# Patient Record
Sex: Male | Born: 1955 | Race: White | Hispanic: No | Marital: Married | Smoking: Current every day smoker
Health system: Southern US, Community
[De-identification: ages and names within clinical notes are randomized; demographics above are authoritative.]

## PROBLEM LIST (undated history)

## (undated) DIAGNOSIS — N200 Calculus of kidney: Secondary | ICD-10-CM

## (undated) DIAGNOSIS — I10 Essential (primary) hypertension: Secondary | ICD-10-CM

## (undated) HISTORY — DX: Calculus of kidney: N20.0

## (undated) HISTORY — PX: OTHER SURGICAL HISTORY: SHX169

---

## 2013-08-10 ENCOUNTER — Encounter: Payer: Self-pay | Admitting: *Deleted

## 2013-08-27 ENCOUNTER — Encounter: Payer: Self-pay | Admitting: Family Medicine

## 2013-08-27 ENCOUNTER — Ambulatory Visit (INDEPENDENT_AMBULATORY_CARE_PROVIDER_SITE_OTHER): Payer: Managed Care, Other (non HMO) | Admitting: Family Medicine

## 2013-08-27 VITALS — BP 160/90 | HR 98 | Temp 98.2°F | Resp 16 | Ht 69.0 in | Wt 170.0 lb

## 2013-08-27 DIAGNOSIS — F17200 Nicotine dependence, unspecified, uncomplicated: Secondary | ICD-10-CM

## 2013-08-27 DIAGNOSIS — Z125 Encounter for screening for malignant neoplasm of prostate: Secondary | ICD-10-CM

## 2013-08-27 DIAGNOSIS — Z0000 Encounter for general adult medical examination without abnormal findings: Secondary | ICD-10-CM

## 2013-08-27 DIAGNOSIS — Z720 Tobacco use: Secondary | ICD-10-CM | POA: Insufficient documentation

## 2013-08-27 DIAGNOSIS — R030 Elevated blood-pressure reading, without diagnosis of hypertension: Secondary | ICD-10-CM

## 2013-08-27 LAB — CBC WITH DIFFERENTIAL/PLATELET
BASOS PCT: 0 % (ref 0–1)
Basophils Absolute: 0 10*3/uL (ref 0.0–0.1)
Eosinophils Absolute: 0.2 10*3/uL (ref 0.0–0.7)
Eosinophils Relative: 2 % (ref 0–5)
HCT: 46.1 % (ref 39.0–52.0)
HEMOGLOBIN: 16.5 g/dL (ref 13.0–17.0)
Lymphocytes Relative: 24 % (ref 12–46)
Lymphs Abs: 2.3 10*3/uL (ref 0.7–4.0)
MCH: 31.1 pg (ref 26.0–34.0)
MCHC: 35.8 g/dL (ref 30.0–36.0)
MCV: 86.8 fL (ref 78.0–100.0)
Monocytes Absolute: 0.9 10*3/uL (ref 0.1–1.0)
Monocytes Relative: 9 % (ref 3–12)
NEUTROS ABS: 6.3 10*3/uL (ref 1.7–7.7)
NEUTROS PCT: 65 % (ref 43–77)
Platelets: 347 10*3/uL (ref 150–400)
RBC: 5.31 MIL/uL (ref 4.22–5.81)
RDW: 12.9 % (ref 11.5–15.5)
WBC: 9.7 10*3/uL (ref 4.0–10.5)

## 2013-08-27 LAB — LIPID PANEL
Cholesterol: 190 mg/dL (ref 0–200)
HDL: 37 mg/dL — ABNORMAL LOW (ref >39)
LDL CALC: 128 mg/dL — AB (ref 0–99)
Total CHOL/HDL Ratio: 5.1 Ratio
Triglycerides: 127 mg/dL (ref <150)
VLDL: 25 mg/dL (ref 0–40)

## 2013-08-27 LAB — COMPREHENSIVE METABOLIC PANEL
ALK PHOS: 75 U/L (ref 39–117)
ALT: 12 U/L (ref 0–53)
AST: 16 U/L (ref 0–37)
Albumin: 4.3 g/dL (ref 3.5–5.2)
BUN: 11 mg/dL (ref 6–23)
CO2: 29 mEq/L (ref 19–32)
Calcium: 9.5 mg/dL (ref 8.4–10.5)
Chloride: 104 mEq/L (ref 96–112)
Creat: 0.96 mg/dL (ref 0.50–1.35)
Glucose, Bld: 89 mg/dL (ref 70–99)
Potassium: 4.6 mEq/L (ref 3.5–5.3)
SODIUM: 142 meq/L (ref 135–145)
Total Bilirubin: 0.8 mg/dL (ref 0.2–1.2)
Total Protein: 6.9 g/dL (ref 6.0–8.3)

## 2013-08-27 MED ORDER — VARENICLINE TARTRATE 1 MG PO TABS: 1.0000 mg | ORAL_TABLET | Freq: Two times a day (BID) | ORAL | Status: DC

## 2013-08-27 NOTE — Assessment & Plan Note (Signed)
Start chatinx, start pack for first 2 weeks given as sample

## 2013-08-27 NOTE — Progress Notes (Signed)
Patient ID: Donald ShengDavid Pedregon, male   DOB: 1955/07/09, 58 y.o.   MRN: [id number]   Subjective:    Patient ID: Donald Hale, male    DOB: 1955/07/09, 58 y.o.   MRN: [id number]  Patient presents for New Patient  Patient here to establish care. No previous PCP he was seen a few years ago when he had kidney stones otherwise discussed the urgent care if needed. He takes vitamins on a regular basis as well as aspirin 81 mg. He does have a family history of heart disease his mother died at age 57 with a heart attack. His family otherwise does not do the doctor on a regular basis therefore he is unsure of any other medical history. His only surgeries tonsils removed when he was a young child. He does smoke a pack and a half a day would like to try to quit. He's tried all the over-the-counter medications including patches and would like to try Chantix.  He has poor dentition and is trying to get his teeth removed and dentures placed continue he needs a physical examination first before anything could be done.  He's never had a colonoscopy no immunizations and declines these today. He also declines rectal examination   Review Of Systems:  GEN- denies fatigue, fever, weight loss,weakness, recent illness HEENT- denies eye drainage, change in vision, nasal discharge, CVS- denies chest pain, palpitations RESP- denies SOB, cough, wheeze ABD- denies N/V, change in stools, abd pain GU- denies dysuria, hematuria, dribbling, incontinence MSK- denies joint pain, muscle aches, injury Neuro- denies headache, dizziness, syncope, seizure activity       Objective:    BP 162/98  Pulse 98  Temp(Src) 98.2 F (36.8 C) (Oral)  Resp 16  Ht 5\' 9"  (1.753 m)  Wt 170 lb (77.111 kg)  BMI 25.09 kg/m2 GEN- NAD, alert and oriented x3 HEENT- PERRL, EOMI, non injected sclera, pink conjunctiva, MMM, oropharynx clear- poor dentition, TM clear bilat no effusion, nares clear Neck- Supple, no thyromegaly CVS- RRR, no  murmur RESP-CTAB ABD-NABS,soft,NT,ND GU- Deferred Psych- normal affect and mood EXT- No edema Pulses- Radial, DP- 2+  EKG- NSR, no ST changes      Assessment & Plan:      Problem List Items Addressed This Visit   None      Note: This dictation was prepared with Dragon dictation along with smaller phrase technology. Any transcriptional errors that result from this process are unintentional.

## 2013-08-27 NOTE — Assessment & Plan Note (Signed)
CPE done Declines colonoscopy Declines TDAP Fasting labs to be done, PSA screening

## 2013-08-27 NOTE — Patient Instructions (Addendum)
We will call with lab results Start chantix, set your quit date I recommend colonoscopy, please review material on this  F/U 6 weeks for blood pressureColorectal Cancer Screening Colorectal cancer screening is done to detect early disease. Colorectal refers to the colon and rectum. The colon and rectum are located at the end of the large intestine (digestive system), and carry your bowel movements out of the body. Screening may be done even if you are not experiencing symptoms.  Colorectal cancer screening checks for:  Polyps. These are small growths in the lining of the colon that can turn cancerous.  Cancer that is already growing. Cancer is a cluster of abnormal cells that can cause problems in the body. REASONS FOR COLORECTAL CANCER SCREENING  It is common for polyps to form in the lining of the colon, especially in older people. These polyps can be cancerous or become cancerous.  Caught early, colorectal cancer is treatable.  Cancer can be life threatening. Detecting or preventing cancer early can save your life and allow you to enjoy life longer. TYPES OF SCREENING  Fecal occult blood testing. A stool sample is examined for blood in the laboratory.  Sigmoidoscopy. A sigmoidoscope is used to examine the rectum and lower colon. A sigmoidoscope is a flexible tube with a camera that is inserted through your anus to examine your lower rectum.  Colonoscopy. The longer colonoscope is used to examine the entire colon. A colonoscope is also a thin, flexible tube with a camera. This test examines the colon and rectum. Other tests include:  Digital rectal exam.  Barium enema.  Stool DNA test.  Virtual colonoscopy is the use of computerized X-ray scan (computed tomography, CT) to take X-ray images of your colon. WHO SHOULD HAVE COLORECTAL CANCER SCREENING?  Screening is recommended for all adults aged 57 to 75 years.  Screening is generally done every 5 to 10 years or more frequently if  you have a family history or symptoms.  Screening is rarely recommended in adults aged 57 to 85 years. Screening is not recommended in adults aged 57 years and older. Your caregiver may recommend screening at a younger age and more frequent screening if you have:  A history of colorectal cancer or polyps.  Family members with histories of colorectal cancer or polyps.  Inflammatory bowel disease, such as ulcerative colitis or Crohn's disease.  A type of hereditary colon cancer syndrome. Talk with your caregiver about any symptoms, personal and family history. SYMPTOMS OF COLORECTAL CANCER It is important to discuss the following symptoms with your caregiver. These symptoms may be the result of other conditions and may be easily treated:  Rectal bleeding.  Blood in your stool.  Changes in bowel movements (hard or loose stools). These changes may last several weeks.  Abdominal cramping.  Feeling the pressure to have a bowel movement when there is no bowel movement.  Feeling tired or weak.  Unexplained weight loss.  Unexplained low red blood cell count. This may also be called iron deficiency anemia. HOME CARE INSTRUCTIONS   Follow up with your caregiver as directed.  Follow all instructions for preparation before your test as well as after. PREVENTION  Following healthy lifestyle habits each day can reduce your chance of getting colorectal cancer and many other types of cancer:  Eat a healthy, well-balanced diet rich in fruits and vegetables and low in fats, sugars and cholesterol.  Stay active. Try to exercise at least 4 to 6 times per week for 30 minutes.  Maintain a healthy weight. Ask your caregiver what a healthy weight range is for you.  Women should only drink 1 alcoholic drink per day. Men should only drink 2 alcoholic drinks per day.  Quit smoking. SEEK MEDICAL CARE IF:   You experience abdominal or rectal symptoms (see Symptoms of Colorectal Cancer).  Your  gastrointestinal issues (constipation, diarrhea) do not go away as expected.  You have questions or concerns. FOR MORE INFORMATION  American Academy of Family Physicians www.familydoctor.org  Centers for Disease Control and Prevention FootballExhibition.com.br  [place] Preventive Services Task Force www.uspreventiveservicestaskforce.org  American Cancer Society www.cancer.org MAKE SURE YOU:   Understand these instructions.  Will watch your condition.  Will get help right away if you are not doing well or get worse. Always follow up with your caregiver to find out the results of your tests. Not all test results may be available during your visit. If your test results are not back during the visit, make an appointment with your caregiver to find out the results. Do not assume everything is normal if you have not heard from your caregiver or the medical facility. It is important for you to follow up on all of your test results.  Document Released: 07/04/2009 Document Revised: 04/08/2011 Document Reviewed: 04/22/2013 [facility] Patient Information 2015 [place], [place]. This information is not intended to replace advice given to you by your health care provider. Make sure you discuss any questions you have with your health care provider.

## 2013-08-27 NOTE — Assessment & Plan Note (Addendum)
Blood pressure elevated today. Will have her followup in 6 weeks. Did do an EKG due to his family history of heart disease and elevated blood pressure today which was normal. His blood pressure still elevated in 6 weeks and will start him on blood pressure medication.

## 2013-08-28 LAB — PSA: PSA: 0.73 ng/mL (ref <=4.00)

## 2013-08-30 ENCOUNTER — Encounter: Payer: Self-pay | Admitting: *Deleted

## 2013-09-13 ENCOUNTER — Telehealth: Payer: Self-pay | Admitting: Family Medicine

## 2013-09-13 MED ORDER — BUPROPION HCL ER (SMOKING DET) 150 MG PO TB12: 150.0000 mg | ORAL_TABLET | Freq: Two times a day (BID) | ORAL | Status: DC

## 2013-09-13 NOTE — Telephone Encounter (Signed)
Prescription sent to pharmacy. .   Call placed to patient and patient made aware.  

## 2013-09-13 NOTE — Telephone Encounter (Signed)
MD please advise

## 2013-09-13 NOTE — Telephone Encounter (Signed)
Patient says that the chantix we prescribed, the insurance will not pay for it and it is 400 dollars. Wants to know if there is anything else we can prescribe to quit smoking  [phone] [address] [facility]

## 2013-09-13 NOTE — Telephone Encounter (Signed)
Send Zyban 150mg  BID,, start zyban 150mg  once a day for 3 days, then increase to BID dosing

## 2013-09-21 ENCOUNTER — Emergency Department (HOSPITAL_COMMUNITY)
Admission: EM | Admit: 2013-09-21 | Discharge: 2013-09-21 | Disposition: A | Discharge status: Home / Self Care | Payer: Managed Care, Other (non HMO) | Attending: Emergency Medicine | Admitting: Emergency Medicine

## 2013-09-21 ENCOUNTER — Emergency Department (HOSPITAL_COMMUNITY): Payer: Managed Care, Other (non HMO)

## 2013-09-21 ENCOUNTER — Encounter (HOSPITAL_COMMUNITY): Payer: Self-pay | Admitting: Emergency Medicine

## 2013-09-21 ENCOUNTER — Encounter: Payer: Self-pay | Admitting: Family Medicine

## 2013-09-21 DIAGNOSIS — R51 Headache: Secondary | ICD-10-CM | POA: Insufficient documentation

## 2013-09-21 DIAGNOSIS — R519 Headache, unspecified: Secondary | ICD-10-CM

## 2013-09-21 DIAGNOSIS — Z79899 Other long term (current) drug therapy: Secondary | ICD-10-CM | POA: Insufficient documentation

## 2013-09-21 DIAGNOSIS — F17200 Nicotine dependence, unspecified, uncomplicated: Secondary | ICD-10-CM | POA: Diagnosis not present

## 2013-09-21 DIAGNOSIS — Z7982 Long term (current) use of aspirin: Secondary | ICD-10-CM | POA: Diagnosis not present

## 2013-09-21 DIAGNOSIS — I10 Essential (primary) hypertension: Secondary | ICD-10-CM

## 2013-09-21 DIAGNOSIS — Z87442 Personal history of urinary calculi: Secondary | ICD-10-CM | POA: Diagnosis not present

## 2013-09-21 DIAGNOSIS — J329 Chronic sinusitis, unspecified: Secondary | ICD-10-CM | POA: Diagnosis not present

## 2013-09-21 LAB — BASIC METABOLIC PANEL
Anion gap: 13 (ref 5–15)
BUN: 9 mg/dL (ref 6–23)
CO2: 25 mEq/L (ref 19–32)
CREATININE: 0.99 mg/dL (ref 0.50–1.35)
Calcium: 9 mg/dL (ref 8.4–10.5)
Chloride: 101 mEq/L (ref 96–112)
GFR, EST NON AFRICAN AMERICAN: 89 mL/min — AB (ref >90)
Glucose, Bld: 98 mg/dL (ref 70–99)
Potassium: 3.3 mEq/L — ABNORMAL LOW (ref 3.7–5.3)
Sodium: 139 mEq/L (ref 137–147)

## 2013-09-21 LAB — CBC
HEMATOCRIT: 44.7 % (ref 39.0–52.0)
Hemoglobin: 15.4 g/dL (ref 13.0–17.0)
MCH: 30.3 pg (ref 26.0–34.0)
MCHC: 34.5 g/dL (ref 30.0–36.0)
MCV: 88 fL (ref 78.0–100.0)
Platelets: 318 10*3/uL (ref 150–400)
RBC: 5.08 MIL/uL (ref 4.22–5.81)
RDW: 12.8 % (ref 11.5–15.5)
WBC: 10 10*3/uL (ref 4.0–10.5)

## 2013-09-21 LAB — I-STAT TROPONIN, ED
Troponin i, poc: 0 ng/mL (ref 0.00–0.08)
Troponin i, poc: 0 ng/mL (ref 0.00–0.08)

## 2013-09-21 MED ORDER — AEROCHAMBER PLUS W/MASK MISC
1.0000 | Freq: Once | Status: DC
  Filled 2013-09-21: qty 1

## 2013-09-21 MED ORDER — ALBUTEROL SULFATE (2.5 MG/3ML) 0.083% IN NEBU
5.0000 mg | INHALATION_SOLUTION | Freq: Once | RESPIRATORY_TRACT | Status: AC
  Administered 2013-09-21: 5 mg via RESPIRATORY_TRACT
  Filled 2013-09-21: qty 6

## 2013-09-21 MED ORDER — KETOROLAC TROMETHAMINE 60 MG/2ML IM SOLN
60.0000 mg | Freq: Once | INTRAMUSCULAR | Status: AC
  Administered 2013-09-21: 60 mg via INTRAMUSCULAR
  Filled 2013-09-21: qty 2

## 2013-09-21 MED ORDER — ALBUTEROL SULFATE HFA 108 (90 BASE) MCG/ACT IN AERS
2.0000 | INHALATION_SPRAY | RESPIRATORY_TRACT | Status: DC | PRN
  Administered 2013-09-21: 2 via RESPIRATORY_TRACT
  Filled 2013-09-21: qty 6.7

## 2013-09-21 NOTE — ED Provider Notes (Signed)
Medical screening examination/treatment/procedure(s) were performed by non-physician practitioner and as supervising physician I was immediately available for consultation/collaboration.   EKG Interpretation   Date/Time:  Tuesday September 21 2013 15:53:00 EDT Ventricular Rate:  81 PR Interval:  152 QRS Duration: 94 QT Interval:  388 QTC Calculation: 450 R Axis:   89 Text Interpretation:  Normal sinus rhythm Possible Left atrial enlargement  Cannot rule out Anterior infarct , age undetermined Abnormal ECG No old  tracing to compare Confirmed by Nicolai Labonte,  DO, Kamaree Wheatley [phone]) on 09/21/2013  6:18:21 PM        Layla Maw Adriann Thau, DO 09/21/13 2238

## 2013-09-21 NOTE — ED Provider Notes (Signed)
CSN: [id number]     Arrival date & time 09/21/13  1527 History   First MD Initiated Contact with Patient 09/21/13 1735     Chief Complaint  Patient presents with  . Hypertension     (Consider location/radiation/quality/duration/timing/severity/associated sxs/prior Treatment) The history is provided by the patient and medical records. No language interpreter was used.    Donald Hale is a 58 y.o. male  with a hx of kidney stones presents to the Emergency Department complaining of gradual, persistent, progressively worsening chest tightness, headaches and HTN onset 4 days ago. Associated symptoms include fatigue.  Pt reports he has noted his BP being high.  Pt reports 2 weeks ago he saw his PCP and he had HTN at that time (190/160).   Pt describes his chest as if someone is standing on it for the last 3 years, but tightness worsened 1 week ago.  He reports takin Zyban for the last week in an effort to stop smoking.   Pt reports walking makes the chest heaviness worse and nothing makes it better.  Patient reports episode of "sweating"  midmorning today but denies becoming cold and clammy. Pt denies fever, chills, headache, neck pain, abd pain, N/V/D, weakness, dizziness.  Pt reports his headache began 4 days ago and it feels like a constant pressure over the entire head.  Pt denies URI symptoms.  Pt reports he often gets sinus headaches, but this one feels different.  Pt reports chronic sinus issues.      Past Medical History  Diagnosis Date  . Kidney stones    Past Surgical History  Procedure Laterality Date  . Tonsillectomy  age 83   Family History  Problem Relation Age of Onset  . Heart disease Mother    History  Substance Use Topics  . Smoking status: Current Every Day Smoker -- 2.00 packs/day    Types: Cigarettes  . Smokeless tobacco: Never Used  . Alcohol Use: 4.8 oz/week    8 Cans of beer per week    Review of Systems  Constitutional: Negative for fever, diaphoresis,  appetite change, fatigue and unexpected weight change.  HENT: Negative for mouth sores.   Eyes: Negative for visual disturbance.  Respiratory: Positive for shortness of breath ( intermittent ). Negative for cough, chest tightness and wheezing.   Cardiovascular: Positive for chest pain.  Gastrointestinal: Negative for nausea, vomiting, abdominal pain, diarrhea and constipation.  Endocrine: Negative for polydipsia, polyphagia and polyuria.  Genitourinary: Negative for dysuria, urgency, frequency and hematuria.  Musculoskeletal: Negative for back pain and neck stiffness.  Skin: Negative for rash.  Allergic/Immunologic: Negative for immunocompromised state.  Neurological: Positive for headaches. Negative for syncope and light-headedness.  Hematological: Does not bruise/bleed easily.  Psychiatric/Behavioral: Negative for sleep disturbance. The patient is not nervous/anxious.       Allergies  Review of patient's allergies indicates no known allergies.  Home Medications   Prior to Admission medications   Medication Sig Start Date End Date Taking? Authorizing Provider  aspirin 81 MG chewable tablet Chew 81 mg by mouth daily.   Yes Historical Provider, MD  buPROPion (ZYBAN) 150 MG 12 hr tablet Take 150 mg by mouth 2 (two) times daily. 09/13/13  Yes Salley Scarlet, MD  Multiple Vitamin (MULTIVITAMIN WITH MINERALS) TABS tablet Take 1 tablet by mouth daily.   Yes Historical Provider, MD  Omega-3 Fatty Acids (FISH OIL PO) Take 1 capsule by mouth daily.   Yes Historical Provider, MD   BP 153/95  Pulse  79  Temp(Src) 98 F (36.7 C) (Oral)  Resp 17  Ht  (1.727 m)  Wt 168 lb (76.204 kg)  BMI 25.55 kg/m2  SpO2 96% Physical Exam  Nursing note and vitals reviewed. Constitutional: He is oriented to person, place, and time. He appears well-developed and well-nourished. No distress.  Awake, alert, nontoxic appearance  HENT:  Head: Normocephalic and atraumatic.  Nose: Nose normal.   Mouth/Throat: Oropharynx is clear and moist. No oropharyngeal exudate.  Eyes: Conjunctivae and EOM are normal. Pupils are equal, round, and reactive to light. No scleral icterus.  No horizontal, vertical or rotational nystagmus  Neck: Normal range of motion. Neck supple.  Full active and passive ROM without pain No midline or paraspinal tenderness No nuchal rigidity or meningeal signs  Cardiovascular: Normal rate, regular rhythm, normal heart sounds and intact distal pulses.   No murmur heard. Pulmonary/Chest: Effort normal and breath sounds normal. No respiratory distress. He has no wheezes. He has no rales.  Equal chest expansion  Abdominal: Soft. Bowel sounds are normal. He exhibits no mass. There is no tenderness. There is no rebound and no guarding.  abd soft and nontender  Musculoskeletal: Normal range of motion. He exhibits no edema.  Lymphadenopathy:    He has no cervical adenopathy.  Neurological: He is alert and oriented to person, place, and time. He has normal reflexes. No cranial nerve deficit. He exhibits normal muscle tone. Coordination normal.  Mental Status:  Alert, oriented, thought content appropriate. Speech fluent without evidence of aphasia. Able to follow 2 step commands without difficulty.  Cranial Nerves:  II:  Peripheral visual fields grossly normal, pupils equal, round, reactive to light III,IV, VI: ptosis not present, extra-ocular motions intact bilaterally  V,VII: smile symmetric, facial light touch sensation equal VIII: hearing grossly normal bilaterally  IX,X: gag reflex present  XI: bilateral shoulder shrug equal and strong XII: midline tongue extension  Motor:  5/5 in upper and lower extremities bilaterally including strong and equal grip strength and dorsiflexion/plantar flexion Sensory: Pinprick and light touch normal in all extremities.  Deep Tendon Reflexes: 2+ and symmetric  Cerebellar: normal finger-to-nose with bilateral upper  extremities Gait: normal gait and balance CV: distal pulses palpable throughout   Skin: Skin is warm and dry. No rash noted. He is not diaphoretic. No erythema.  Psychiatric: He has a normal mood and affect. His behavior is normal. Judgment and thought content normal.    ED Course  Procedures (including critical care time) Labs Review Labs Reviewed  BASIC METABOLIC PANEL - Abnormal; Notable for the following:    Potassium 3.3 (*)    GFR calc non Af Amer 89 (*)    All other components within normal limits  CBC  I-STAT TROPOININ, ED  Rosezena Sensor, ED    Imaging Review Dg Chest 2 View  09/21/2013   CLINICAL DATA:  Cough, chest pain  EXAM: CHEST  2 VIEW  COMPARISON:  None.  FINDINGS: The heart size and mediastinal contours are within normal limits. Both lungs are clear. The visualized skeletal structures are unremarkable.  IMPRESSION: No active cardiopulmonary disease.   Electronically Signed   By: Natasha Mead M.D.   On: 09/21/2013 20:15   Ct Head Wo Contrast  09/21/2013   CLINICAL DATA:  Hypertensive headache  EXAM: CT HEAD WITHOUT CONTRAST  TECHNIQUE: Contiguous axial images were obtained from the base of the skull through the vertex without intravenous contrast.  COMPARISON:  None.  FINDINGS: Sinuses/Soft tissues: Small mucous retention  cyst or polyp in the left maxillary sinus. Mucosal thickening in the right frontal sinus. Clear mastoid air cells.  Intracranial: Mild cerebral atrophy. No mass lesion, hemorrhage, hydrocephalus, acute infarct, intra-axial, or extra-axial fluid collection.  IMPRESSION: 1.  No acute intracranial abnormality. 2. Sinus disease.   Electronically Signed   By: Jeronimo Greaves M.D.   On: 09/21/2013 18:31     EKG Interpretation   Date/Time:  Tuesday September 21 2013 15:53:00 EDT Ventricular Rate:  81 PR Interval:  152 QRS Duration: 94 QT Interval:  388 QTC Calculation: 450 R Axis:   89 Text Interpretation:  Normal sinus rhythm Possible Left atrial  enlargement  Cannot rule out Anterior infarct , age undetermined Abnormal ECG No old  tracing to compare Confirmed by WARD,  DO, KRISTEN ([id number]) on 09/21/2013  6:18:21 PM      MDM   Final diagnoses:  Acute nonintractable headache, unspecified headache type  Chronic sinusitis, unspecified location  Essential hypertension   Burnett Sheng presents with chest pain for 3 years, worse over the last week with new medication as well as fatigue.  Patient reports persistent chest discomfort and headache. Normal neurologic exam and normal CT head. We'll give Toradol for headache and reassess. Pending chest x-ray.  Labs reassuring without evidence of endorgan failure. Will repeat troponin.  Pt with decreased lung sounds throughout and a heavy smoker, will give albuterol.    8:45PM Patient with significantly improved breath sounds and reports complete resolution of chest tightness. Patient also reports complete resolution of headache.  Chest x-ray without acute abnormality, repeat troponin normal.  ECG was nonischemic and I doubt ACS at this time. Patient's heart score is 3, therefore he is low risk. Patient has a primary care physician and has an appointment in less than one week. Patient's blood pressure decrease spontaneously while here in the emergency department. Will not DC home on antihypertensives but request that he see his primary care for this.  BP 153/95  Pulse 79  Temp(Src) 98 F (36.7 C) (Oral)  Resp 17  Ht  (1.727 m)  Wt 168 lb (76.204 kg)  BMI 25.55 kg/m2  SpO2 96%  The patient was discussed with Dr. Elesa Massed who agrees with the treatment plan.     Dahlia Client Darol Cush, PA-C 09/21/13 2203

## 2013-09-21 NOTE — ED Notes (Signed)
Pt in c/o hypertension, states two weeks ago it was elevated at his MD office and they scheduled a follow up to re-assess, over the last two days he has had a headache and diaphoresis, during triage pt reports episodes of chest tightness at home, denies any chest pain at this time

## 2013-09-21 NOTE — Discharge Instructions (Signed)
1. Medications: usual home medications 2. Treatment: rest, drink plenty of fluids, smoking cessation as discussed 3. Follow Up: Please followup with your primary doctor for discussion of your diagnoses and further evaluation after today's visit;    Hypertension Hypertension, commonly called high blood pressure, is when the force of blood pumping through your arteries is too strong. Your arteries are the blood vessels that carry blood from your heart throughout your body. A blood pressure reading consists of a higher number over a lower number, such as 110/72. The higher number (systolic) is the pressure inside your arteries when your heart pumps. The lower number (diastolic) is the pressure inside your arteries when your heart relaxes. Ideally you want your blood pressure below 120/80. Hypertension forces your heart to work harder to pump blood. Your arteries may become narrow or stiff. Having hypertension puts you at risk for heart disease, stroke, and other problems.  RISK FACTORS Some risk factors for high blood pressure are controllable. Others are not.  Risk factors you cannot control include:   Race. You may be at higher risk if you are African American.  Age. Risk increases with age.  Gender. Men are at higher risk than women before age 25 years. After age 63, women are at higher risk than men. Risk factors you can control include:  Not getting enough exercise or physical activity.  Being overweight.  Getting too much fat, sugar, calories, or salt in your diet.  Drinking too much alcohol. SIGNS AND SYMPTOMS Hypertension does not usually cause signs or symptoms. Extremely high blood pressure (hypertensive crisis) may cause headache, anxiety, shortness of breath, and nosebleed. DIAGNOSIS  To check if you have hypertension, your health care provider will measure your blood pressure while you are seated, with your arm held at the level of your heart. It should be measured at least  twice using the same arm. Certain conditions can cause a difference in blood pressure between your right and left arms. A blood pressure reading that is higher than normal on one occasion does not mean that you need treatment. If one blood pressure reading is high, ask your health care provider about having it checked again. TREATMENT  Treating high blood pressure includes making lifestyle changes and possibly taking medicine. Living a healthy lifestyle can help lower high blood pressure. You may need to change some of your habits. Lifestyle changes may include:  Following the DASH diet. This diet is high in fruits, vegetables, and whole grains. It is low in salt, red meat, and added sugars.  Getting at least 2 hours of brisk physical activity every week.  Losing weight if necessary.  Not smoking.  Limiting alcoholic beverages.  Learning ways to reduce stress. If lifestyle changes are not enough to get your blood pressure under control, your health care provider may prescribe medicine. You may need to take more than one. Work closely with your health care provider to understand the risks and benefits. HOME CARE INSTRUCTIONS  Have your blood pressure rechecked as directed by your health care provider.   Take medicines only as directed by your health care provider. Follow the directions carefully. Blood pressure medicines must be taken as prescribed. The medicine does not work as well when you skip doses. Skipping doses also puts you at risk for problems.   Do not smoke.   Monitor your blood pressure at home as directed by your health care provider. SEEK MEDICAL CARE IF:   You think you are having a  reaction to medicines taken.  You have recurrent headaches or feel dizzy.  You have swelling in your ankles.  You have trouble with your vision. SEEK IMMEDIATE MEDICAL CARE IF:  You develop a severe headache or confusion.  You have unusual weakness, numbness, or feel  faint.  You have severe chest or abdominal pain.  You vomit repeatedly.  You have trouble breathing. MAKE SURE YOU:   Understand these instructions.  Will watch your condition.  Will get help right away if you are not doing well or get worse. Document Released: 01/14/2005 Document Revised: 05/31/2013 Document Reviewed: 11/06/2012 [facility] Patient Information 2015 [place], [place]. This information is not intended to replace advice given to you by your health care provider. Make sure you discuss any questions you have with your health care provider.

## 2013-09-21 NOTE — ED Notes (Signed)
Patient returned from X-ray 

## 2013-09-21 NOTE — ED Notes (Signed)
Patient returned from CT

## 2013-09-21 NOTE — ED Notes (Signed)
Pt continues to be monitored by blood pressure, pulse ox, and 5 lead. Pts family remains at bedside.  

## 2013-09-27 ENCOUNTER — Ambulatory Visit (INDEPENDENT_AMBULATORY_CARE_PROVIDER_SITE_OTHER): Payer: Managed Care, Other (non HMO) | Admitting: Family Medicine

## 2013-09-27 ENCOUNTER — Encounter: Payer: Self-pay | Admitting: Family Medicine

## 2013-09-27 VITALS — BP 172/96 | HR 78 | Temp 97.9°F | Resp 14 | Ht 70.0 in | Wt 172.0 lb

## 2013-09-27 DIAGNOSIS — F17200 Nicotine dependence, unspecified, uncomplicated: Secondary | ICD-10-CM

## 2013-09-27 DIAGNOSIS — J320 Chronic maxillary sinusitis: Secondary | ICD-10-CM

## 2013-09-27 DIAGNOSIS — J329 Chronic sinusitis, unspecified: Secondary | ICD-10-CM

## 2013-09-27 DIAGNOSIS — I10 Essential (primary) hypertension: Secondary | ICD-10-CM | POA: Insufficient documentation

## 2013-09-27 HISTORY — DX: Chronic sinusitis, unspecified: J32.9

## 2013-09-27 MED ORDER — FLUTICASONE PROPIONATE 50 MCG/ACT NA SUSP: 2.0000 | Freq: Every day | NASAL | Status: DC | PRN

## 2013-09-27 MED ORDER — AMLODIPINE BESYLATE 5 MG PO TABS: 5.0000 mg | ORAL_TABLET | Freq: Every day | ORAL | Status: DC

## 2013-09-27 NOTE — Patient Instructions (Signed)
Try the nicotine patches Start blood pressure medication Sinus- flonase spray F/U 4 weeks

## 2013-09-27 NOTE — Progress Notes (Signed)
Patient ID: Donald Hale, male   DOB: 03-08-1955, 58 y.o.   MRN: [id number]   Subjective:    Patient ID: Donald Hale, male    DOB: 08-04-1955, 58 y.o.   MRN: [id number]  Patient presents for BP check  Pt here to f/u ER visit, was seen secondary to chest pain and headache which started when he started zyban for tobacco cessation. He has been off medication for past 5 days. Torponoin, EKG, CXR, CT of head all negative. BP was elevated during admission as well. Today no particular concerns, ER visit reviewed with him.  He does have have sinus drainage mostly clear and post nasal drip, his CT scan did show chronic sinus disease.    Review Of Systems:  GEN- denies fatigue, fever, weight loss,weakness, recent illness HEENT- denies eye drainage, change in vision, nasal discharge, CVS- denies chest pain, palpitations RESP- denies SOB, cough, wheeze ABD- denies N/V, change in stools, abd pain GU- denies dysuria, hematuria, dribbling, incontinence MSK- denies joint pain, muscle aches, injury Neuro- denies headache, dizziness, syncope, seizure activity       Objective:    BP 172/96  Pulse 78  Temp(Src) 97.9 F (36.6 C) (Oral)  Resp 14  Ht  (1.778 m)  Wt 172 lb (78.019 kg)  BMI 24.68 kg/m2 GEN- NAD, alert and oriented x3 HEENT- PERRL, EOMI, non injected sclera, pink conjunctiva, MMM, oropharynx clear, nares clear rhinorrhea CVS- RRR, no murmur RESP-CTAB Pulses- Radial 2+        Assessment & Plan:      Problem List Items Addressed This Visit   Tobacco use disorder   Sinusitis, chronic - Primary   Relevant Medications      fluticasone (FLONASE) 50 MCG nasal spray   Essential hypertension, benign   Relevant Medications      amLODIpine (NORVASC) tablet      Note: This dictation was prepared with Dragon dictation along with smaller phrase technology. Any transcriptional errors that result from this process are unintentional.

## 2013-09-27 NOTE — Assessment & Plan Note (Signed)
D/c zyban, trial of OTC patches

## 2013-09-27 NOTE — Assessment & Plan Note (Signed)
Start flonase prn

## 2013-09-27 NOTE — Assessment & Plan Note (Signed)
Start norvasc  once a day, f/u 4 weeks

## 2013-10-08 ENCOUNTER — Ambulatory Visit: Payer: Managed Care, Other (non HMO) | Admitting: Family Medicine

## 2013-10-25 ENCOUNTER — Ambulatory Visit: Payer: Managed Care, Other (non HMO) | Admitting: Family Medicine

## 2013-11-02 ENCOUNTER — Encounter: Payer: Self-pay | Admitting: Family Medicine

## 2013-11-02 ENCOUNTER — Ambulatory Visit (INDEPENDENT_AMBULATORY_CARE_PROVIDER_SITE_OTHER): Payer: Managed Care, Other (non HMO) | Admitting: Family Medicine

## 2013-11-02 VITALS — BP 150/80 | HR 78 | Temp 98.0°F | Resp 16 | Ht 68.0 in | Wt 170.0 lb

## 2013-11-02 DIAGNOSIS — Z23 Encounter for immunization: Secondary | ICD-10-CM

## 2013-11-02 DIAGNOSIS — S40812A Abrasion of left upper arm, initial encounter: Secondary | ICD-10-CM | POA: Insufficient documentation

## 2013-11-02 DIAGNOSIS — N529 Male erectile dysfunction, unspecified: Secondary | ICD-10-CM

## 2013-11-02 DIAGNOSIS — I10 Essential (primary) hypertension: Secondary | ICD-10-CM

## 2013-11-02 MED ORDER — AMLODIPINE BESYLATE 10 MG PO TABS: 10.0000 mg | ORAL_TABLET | Freq: Every day | ORAL | Status: DC

## 2013-11-02 MED ORDER — FLUTICASONE PROPIONATE 50 MCG/ACT NA SUSP: 2.0000 | Freq: Every day | NASAL | Status: DC | PRN

## 2013-11-02 NOTE — Assessment & Plan Note (Signed)
He was given reassurance regarding the bony prominences likely did not heal correctly after he injured her ribs a few years ago. If there is any changes with it and we will image I do not see any need to right now. His blood pressure is uncontrolled will increase the amlodipine 10 mg

## 2013-11-02 NOTE — Patient Instructions (Addendum)
Norvasc increased to 10mg  Trial of viagra Tetanus given F/U 3 months

## 2013-11-02 NOTE — Assessment & Plan Note (Signed)
Tetanus booster given  Wound cleaned at the bedside and triple antibiotic applied

## 2013-11-02 NOTE — Assessment & Plan Note (Signed)
Trial of Viagra 50 mg he was given a sample from the office including 4 tablets

## 2013-11-02 NOTE — Progress Notes (Signed)
Patient ID: Donald ShengDavid Crisp, male   DOB: [birth year], 58 y.o.   MRN: [id number]   Subjective:    Patient ID: Donald Shengavid Larrick, male    DOB: [birth year], 58 y.o.   MRN: [id number]  Patient presents for 4 week F/U  patient here to followup hypertension. He took his blood pressure at a local drug store it was 157/88. He's not had any chest pain shortness of breath. He's been taking Norvasc 5 mg as prescribed.  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 He has cleaned it with peroxide and placed a bandage on it. His tetanus booster is overdue  At the end of the visit he also inquired about something to help with erectile dysfunction he is in a new relationship and has difficulty maintaining his erections. He did have a normal PSA a couple months ago  Review Of Systems:  GEN- denies fatigue, fever, weight loss,weakness, recent illness HEENT- denies eye drainage, change in vision, nasal discharge, CVS- denies chest pain, palpitations RESP- denies SOB, cough, wheeze ABD- denies N/V, change in stools, abd pain GU- denies dysuria, hematuria, dribbling, incontinence MSK- denies joint pain, muscle aches, injury Neuro- denies headache, dizziness, syncope, seizure activity       Objective:    BP 150/80  Pulse 78  Temp(Src) 98 F (36.7 C) (Oral)  Resp 16  Ht 5\' 8"  (1.727 m)  Wt 170 lb (77.111 kg)  BMI 25.85 kg/m2 GEN- NAD, alert and oriented x3 CVS- RRR, no murmur RESP-CTAB MSK- mild bony prominence around T 10 anteriorly NT, no flucutance Skin- Left forearm- 2 x 3 abrasion, no odor, minimal bleeding, small skin tear 3cm beneath it       Assessment & Plan:      Problem List Items Addressed This Visit   Essential hypertension, benign - Primary   Relevant Medications      amLODIpine (NORVASC) tablet      sildenafil (VIAGRA) 50 MG tablet   Erectile dysfunction   Abrasion of arm, left   Relevant Orders      Tdap vaccine greater than or equal to 7yo IM (Completed)    Other Visit Diagnoses   Need for prophylactic vaccination with combined diphtheria-tetanus-pertussis (DTP) vaccine        Relevant Orders       Tdap vaccine greater than or equal to 7yo IM (Completed)       Note: This dictation was prepared with Dragon dictation along with smaller phrase technology. Any transcriptional errors that result from this process are unintentional.

## 2013-11-18 ENCOUNTER — Telehealth: Payer: Self-pay | Admitting: Family Medicine

## 2013-11-18 NOTE — Telephone Encounter (Signed)
Call placed to patient to inquire.   States that he went to get his hair cut on 11/17/2013, and the hairdresser removed a quite a bit of hair from his ear. States that while she was removing hair, he became lightheaded.   Reports that he went by [employer] and checked his BP and noted that it was 117/72. States that he bought automatic cuff and his BP was noted 127/82 this morning. Advised that both BP readings are normal. Advised that if dizziness or lightheaded feeling occurs again to contact our office.   Also requested refill on Viagra.   Ok to refill?

## 2013-11-18 NOTE — Telephone Encounter (Signed)
Patient is calling to get an rx for viagra if possible, and also would like us to know that he almost passed out yesterday and assumed it was related to his blood pressure  Please call him at [phone]

## 2013-11-19 MED ORDER — SILDENAFIL CITRATE 50 MG PO TABS: 50.0000 mg | ORAL_TABLET | Freq: Every day | ORAL | Status: DC | PRN

## 2013-11-19 NOTE — Telephone Encounter (Signed)
Prescription sent to pharmacy.

## 2013-11-19 NOTE — Telephone Encounter (Signed)
Okay to refill viagra Keep a check on blood pressure, call if consistently low

## 2014-03-07 ENCOUNTER — Ambulatory Visit (INDEPENDENT_AMBULATORY_CARE_PROVIDER_SITE_OTHER): Payer: Managed Care, Other (non HMO) | Admitting: Family Medicine

## 2014-03-07 ENCOUNTER — Encounter: Payer: Self-pay | Admitting: Family Medicine

## 2014-03-07 VITALS — BP 130/78 | HR 76 | Temp 98.3°F | Resp 14 | Ht 68.0 in | Wt 173.0 lb

## 2014-03-07 DIAGNOSIS — L989 Disorder of the skin and subcutaneous tissue, unspecified: Secondary | ICD-10-CM

## 2014-03-07 DIAGNOSIS — I10 Essential (primary) hypertension: Secondary | ICD-10-CM

## 2014-03-07 DIAGNOSIS — N529 Male erectile dysfunction, unspecified: Secondary | ICD-10-CM

## 2014-03-07 DIAGNOSIS — Z720 Tobacco use: Secondary | ICD-10-CM

## 2014-03-07 DIAGNOSIS — F17200 Nicotine dependence, unspecified, uncomplicated: Secondary | ICD-10-CM

## 2014-03-07 DIAGNOSIS — J209 Acute bronchitis, unspecified: Secondary | ICD-10-CM

## 2014-03-07 MED ORDER — SILDENAFIL CITRATE 50 MG PO TABS: 50.0000 mg | ORAL_TABLET | Freq: Every day | ORAL | Status: DC | PRN

## 2014-03-07 MED ORDER — FLUTICASONE PROPIONATE 50 MCG/ACT NA SUSP: 2.0000 | Freq: Every day | NASAL | Status: DC | PRN

## 2014-03-07 MED ORDER — AZITHROMYCIN 250 MG PO TABS
ORAL_TABLET | ORAL | Status: DC
Start: 2014-03-07 — End: 2014-05-24

## 2014-03-07 MED ORDER — AMLODIPINE BESYLATE 10 MG PO TABS: 10.0000 mg | ORAL_TABLET | Freq: Every day | ORAL | Status: DC

## 2014-03-07 NOTE — Patient Instructions (Signed)
Use topical antibiotic on your leg Take antibiotics for the chest cold Mucinex DM  Continue current blood pressure medication F/U 6 months for PHYSICAL

## 2014-03-07 NOTE — Progress Notes (Signed)
Patient ID: Donald Hale, male   DOB: [birth year], 59 y.o.   MRN: [id number]   Subjective:    Patient ID: Donald Hale, male    DOB: [birth year], 59 y.o.   MRN: [id number]  Patient presents for 3 month F/U and Cough  Cough with production, occasional wheeze for past 2 weeks, using alka selter which has helped some, no SOB, no fever, + tobacco use 1ppd  HTN- taking BP meds, home readings look good < 130/80, no hypotension  ED- uses viagra sparingly, states dysfunction has improved  Spot on left thigh, he had a small boil he drained now has a hard surface    Review Of Systems:  GEN- denies fatigue, fever, weight loss,weakness, recent illness HEENT- denies eye drainage, change in vision, nasal discharge, CVS- denies chest pain, palpitations RESP- denies SOB, +cough, +wheeze ABD- denies N/V, change in stools, abd pain GU- denies dysuria, hematuria, dribbling, incontinence MSK- denies joint pain, muscle aches, injury Neuro- denies headache, dizziness, syncope, seizure activity       Objective:    BP 130/78 mmHg  Pulse 76  Temp(Src) 98.3 F (36.8 C) (Oral)  Resp 14  Ht 5\' 8"  (1.727 m)  Wt 173 lb (78.472 kg)  BMI 26.31 kg/m2 GEN- NAD, alert and oriented x3 HEENT- PERRL, EOMI, non injected sclera, pink conjunctiva, MMM, oropharynx clear Neck- Supple, no LAD CVS- RRR, no murmur RESP-course rhonchi, no wheeze, no discrete rales, normal wob sKIN- left thigh- small dime size area of mild erythema with scale at center, no flunctance, NT EXT- No edema Pulses- Radial 2+        Assessment & Plan:      Problem List Items Addressed This Visit      Unprioritized   Tobacco use disorder - Primary   Essential hypertension, benign    Other Visit Diagnoses    Acute bronchitis, unspecified organism        Zpak, mucinex DM    Skin lesion        recent infection- no sign of abscess, given topical antibiotic ointment, if it reoccurs with I and D       Note: This dictation was  prepared with Dragon dictation along with smaller phrase technology. Any transcriptional errors that result from this process are unintentional.

## 2014-03-07 NOTE — Assessment & Plan Note (Signed)
BP well controlled.

## 2014-05-24 ENCOUNTER — Encounter: Payer: Self-pay | Admitting: Family Medicine

## 2014-05-24 ENCOUNTER — Ambulatory Visit (INDEPENDENT_AMBULATORY_CARE_PROVIDER_SITE_OTHER): Payer: Managed Care, Other (non HMO) | Admitting: Family Medicine

## 2014-05-24 VITALS — BP 128/70 | HR 76 | Temp 98.3°F | Resp 14 | Ht 68.0 in | Wt 177.0 lb

## 2014-05-24 DIAGNOSIS — L729 Follicular cyst of the skin and subcutaneous tissue, unspecified: Secondary | ICD-10-CM | POA: Diagnosis not present

## 2014-05-24 MED ORDER — CEPHALEXIN 500 MG PO CAPS: 500.0000 mg | ORAL_CAPSULE | Freq: Two times a day (BID) | ORAL | Status: DC

## 2014-05-24 NOTE — Patient Instructions (Signed)
Small cyst, take antibiotics as prescribed  Use warm compress  Call if not improved  F/U as previous

## 2014-05-24 NOTE — Progress Notes (Signed)
Patient ID: Donald ShengDavid Orzel, male   DOB: 11-01-1955, 59 y.o.   MRN: [id number]   Subjective:    Patient ID: Donald Hale Asfour, male    DOB: 11-01-1955, 59 y.o.   MRN: [id number]  Patient presents for Knot to Lower Back  She here with a small tender not to his right lower back that has been present for about a week. He has not had any drainage. No previous cyst. He states it is very sore to touch. Denies any injury to the area. No fever or other systemic symptoms. At the end of the visit he states that he gets a little tired here and there once to make sure that his blood pressure was normal.   Review Of Systems:  GEN- + fatigue, fever, weight loss,weakness, recent illness HEENT- denies eye drainage, change in vision, nasal discharge, CVS- denies chest pain, palpitations RESP- denies SOB, cough, wheeze ABD- denies N/V, change in stools, abd pain Neuro- denies headache, dizziness, syncope, seizure activity       Objective:    BP 128/70 mmHg  Pulse 76  Temp(Src) 98.3 F (36.8 C) (Oral)  Resp 14  Ht 5\' 8"  (1.727 m)  Wt 177 lb (80.287 kg)  BMI 26.92 kg/m2 GEN- NAD, alert and oriented x3 Skin- pea sized knot TTP right lower back, mild erythema, small pit in center, mild fluctuance      Assessment & Plan:      Problem List Items Addressed This Visit    None    Visit Diagnoses    Cyst of skin    -  Primary    Concern for infected cyst, though very small, I am not going to I and D, will treat with warm compress and antibiotics, incise if not improved       Note: This dictation was prepared with Dragon dictation along with smaller phrase technology. Any transcriptional errors that result from this process are unintentional.

## 2014-05-27 ENCOUNTER — Telehealth: Payer: Self-pay | Admitting: Family Medicine

## 2014-05-27 NOTE — Telephone Encounter (Signed)
Patient is currently on ABTx.   Will advised to use warm compress and to monitor.   If no better by Monday, needs OV to eval.   Call placed to patient. LMTRC.

## 2014-05-27 NOTE — Telephone Encounter (Signed)
Patient girlfriend Nelle DonWanda Cobb contacted office.   Reports that new hard area noted to back.   Denies erythema or discomfort at site.   Advised to complete ABTx and to call back on Monday with update.   MD to be made aware.

## 2014-05-27 NOTE — Telephone Encounter (Signed)
noted 

## 2014-05-27 NOTE — Telephone Encounter (Signed)
Patient is calling to say that he feels another "knot"in his back that feels the same as the other one  Please call him back at [phone]

## 2014-09-05 ENCOUNTER — Encounter: Payer: Managed Care, Other (non HMO) | Admitting: Family Medicine

## 2014-09-08 ENCOUNTER — Other Ambulatory Visit: Payer: Self-pay | Admitting: Family Medicine

## 2014-09-08 ENCOUNTER — Other Ambulatory Visit: Payer: Managed Care, Other (non HMO)

## 2014-09-08 DIAGNOSIS — I10 Essential (primary) hypertension: Secondary | ICD-10-CM

## 2014-09-08 DIAGNOSIS — Z0000 Encounter for general adult medical examination without abnormal findings: Secondary | ICD-10-CM

## 2014-09-08 DIAGNOSIS — Z79899 Other long term (current) drug therapy: Secondary | ICD-10-CM

## 2014-09-08 DIAGNOSIS — Z720 Tobacco use: Secondary | ICD-10-CM

## 2014-09-08 DIAGNOSIS — Z125 Encounter for screening for malignant neoplasm of prostate: Secondary | ICD-10-CM

## 2014-09-08 LAB — CBC WITH DIFFERENTIAL/PLATELET
BASOS PCT: 0 % (ref 0–1)
Basophils Absolute: 0 10*3/uL (ref 0.0–0.1)
EOS ABS: 0.3 10*3/uL (ref 0.0–0.7)
Eosinophils Relative: 3 % (ref 0–5)
HEMATOCRIT: 46.1 % (ref 39.0–52.0)
Hemoglobin: 15.8 g/dL (ref 13.0–17.0)
Lymphocytes Relative: 23 % (ref 12–46)
Lymphs Abs: 2.3 10*3/uL (ref 0.7–4.0)
MCH: 30.2 pg (ref 26.0–34.0)
MCHC: 34.3 g/dL (ref 30.0–36.0)
MCV: 88 fL (ref 78.0–100.0)
MONOS PCT: 12 % (ref 3–12)
MPV: 9.1 fL (ref 8.6–12.4)
Monocytes Absolute: 1.2 10*3/uL — ABNORMAL HIGH (ref 0.1–1.0)
NEUTROS ABS: 6.1 10*3/uL (ref 1.7–7.7)
NEUTROS PCT: 62 % (ref 43–77)
Platelets: 330 10*3/uL (ref 150–400)
RBC: 5.24 MIL/uL (ref 4.22–5.81)
RDW: 13.3 % (ref 11.5–15.5)
WBC: 9.9 10*3/uL (ref 4.0–10.5)

## 2014-09-09 LAB — LIPID PANEL
Cholesterol: 186 mg/dL (ref 125–200)
HDL: 35 mg/dL — ABNORMAL LOW (ref >=40)
LDL Cholesterol: 131 mg/dL — ABNORMAL HIGH (ref <130)
Total CHOL/HDL Ratio: 5.3 Ratio — ABNORMAL HIGH (ref <=5.0)
Triglycerides: 102 mg/dL (ref <150)
VLDL: 20 mg/dL (ref <30)

## 2014-09-09 LAB — COMPLETE METABOLIC PANEL WITH GFR
ALK PHOS: 85 U/L (ref 40–115)
ALT: 12 U/L (ref 9–46)
AST: 18 U/L (ref 10–35)
Albumin: 4.2 g/dL (ref 3.6–5.1)
BUN: 12 mg/dL (ref 7–25)
CO2: 23 mmol/L (ref 20–31)
Calcium: 9.3 mg/dL (ref 8.6–10.3)
Chloride: 103 mmol/L (ref 98–110)
Creat: 0.99 mg/dL (ref 0.70–1.33)
GFR, Est African American: >89 mL/min (ref >=60)
GFR, Est Non African American: 84 mL/min (ref >=60)
Glucose, Bld: 112 mg/dL — ABNORMAL HIGH (ref 70–99)
Potassium: 3.8 mmol/L (ref 3.5–5.3)
SODIUM: 137 mmol/L (ref 135–146)
TOTAL PROTEIN: 7.1 g/dL (ref 6.1–8.1)
Total Bilirubin: 0.7 mg/dL (ref 0.2–1.2)

## 2014-09-09 LAB — TSH: TSH: 1.87 u[IU]/mL (ref 0.350–4.500)

## 2014-09-09 LAB — PSA: PSA: 0.49 ng/mL (ref <=4.00)

## 2014-09-09 LAB — HEMOGLOBIN A1C
Hgb A1c MFr Bld: 5.7 % — ABNORMAL HIGH (ref <5.7)
MEAN PLASMA GLUCOSE: 117 mg/dL — AB (ref <117)

## 2014-09-14 ENCOUNTER — Encounter: Payer: Self-pay | Admitting: Family Medicine

## 2014-09-14 ENCOUNTER — Ambulatory Visit (INDEPENDENT_AMBULATORY_CARE_PROVIDER_SITE_OTHER): Payer: Managed Care, Other (non HMO) | Admitting: Family Medicine

## 2014-09-14 VITALS — BP 130/68 | HR 72 | Temp 98.5°F | Resp 16 | Ht 68.0 in | Wt 170.0 lb

## 2014-09-14 DIAGNOSIS — Z0000 Encounter for general adult medical examination without abnormal findings: Secondary | ICD-10-CM

## 2014-09-14 DIAGNOSIS — Z720 Tobacco use: Secondary | ICD-10-CM | POA: Diagnosis not present

## 2014-09-14 DIAGNOSIS — F17200 Nicotine dependence, unspecified, uncomplicated: Secondary | ICD-10-CM

## 2014-09-14 DIAGNOSIS — H029 Unspecified disorder of eyelid: Secondary | ICD-10-CM | POA: Diagnosis not present

## 2014-09-14 DIAGNOSIS — I10 Essential (primary) hypertension: Secondary | ICD-10-CM | POA: Diagnosis not present

## 2014-09-14 DIAGNOSIS — N529 Male erectile dysfunction, unspecified: Secondary | ICD-10-CM | POA: Diagnosis not present

## 2014-09-14 DIAGNOSIS — Z23 Encounter for immunization: Secondary | ICD-10-CM

## 2014-09-14 MED ORDER — SILDENAFIL CITRATE 100 MG PO TABS: 100.0000 mg | ORAL_TABLET | Freq: Every day | ORAL | Status: DC | PRN

## 2014-09-14 MED ORDER — AMLODIPINE BESYLATE 10 MG PO TABS: 10.0000 mg | ORAL_TABLET | Freq: Every day | ORAL | Status: DC

## 2014-09-14 NOTE — Patient Instructions (Signed)
Release of records- [facility] [place] Continue current medications Referral for eye lid surgery Pneumonia vaccine given  F/U 6 months

## 2014-09-14 NOTE — Progress Notes (Signed)
Patient ID: Donald Hale, male   DOB: 07/18/1955, 59 y.o.   MRN: [id number]   Subjective:    Patient ID: Donald Hale, male    DOB: Sep 13, 1955, 59 y.o.   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 He has no particular concerns states that he feels well. He was seen by the eye doctor recently they recommended that he have lid surgery because of the drooping is affecting his vision. He is taking his blood pressure medicine as described. He continues to decline colonoscopy. He still smokes 1 pack per day. His fasting labs were reviewed in detail.    Review Of Systems:  GEN- denies fatigue, fever, weight loss,weakness, recent illness HEENT- denies eye drainage, change in vision, nasal discharge, CVS- denies chest pain, palpitations RESP- denies SOB, cough, wheeze ABD- denies N/V, change in stools, abd pain GU- denies dysuria, hematuria, dribbling, incontinence MSK- denies joint pain, muscle aches, injury Neuro- denies headache, dizziness, syncope, seizure activity       Objective:    BP 130/68 mmHg  Pulse 72  Temp(Src) 98.5 F (36.9 C) (Oral)  Resp 16  Ht  (1.727 m)  Wt 170 lb (77.111 kg)  BMI 25.85 kg/m2 GEN- NAD, alert and oriented x3 HEENT- PERRL, EOMI, non injected sclera, pink conjunctiva, MMM, oropharynx clear, lagging skin upper lids Neck- Supple, no thyromegaly CVS- RRR, no murmur RESP-CTAB ABD-NABS,soft,NT,ND GU- declined  EXT- No edema Pulses- Radial, DP- 2+        Assessment & Plan:      Problem List Items Addressed This Visit    Tobacco use disorder   Routine general medical examination at a health care facility    CPE done , he had teeth removed recently to have plates put in. Pneumonia vaccine given. Not interested in tobacco cessation.  Labs overall look good, work on increasing HDL, fasting glucose elevated but normal A1C. Discussed healthy eating and nutrition, intentional weight loss noted      Essential hypertension, benign    Well controlled, no change to norvasc      Relevant Medications   sildenafil (VIAGRA) 100 MG tablet   amLODipine (NORVASC) 10 MG tablet   Erectile dysfunction    Continue viagra, given sample of 2 pills       Other Visit Diagnoses    Need for prophylactic vaccination against Streptococcus pneumoniae (pneumococcus)    -  Primary    Relevant Orders    Pneumococcal polysaccharide vaccine 23-valent greater than or equal to 2yo subcutaneous/IM (Completed)    Eyelid abnormality        Refer to surgeon for consult for removal       Note: This dictation was prepared with Dragon dictation along with smaller phrase technology. Any transcriptional errors that result from this process are unintentional.

## 2014-09-14 NOTE — Assessment & Plan Note (Signed)
Well controlled, no change to norvasc

## 2014-09-14 NOTE — Assessment & Plan Note (Signed)
Continue viagra, given sample of 2 pills

## 2014-09-14 NOTE — Assessment & Plan Note (Signed)
CPE done , he had teeth removed recently to have plates put in. Pneumonia vaccine given. Not interested in tobacco cessation.  Labs overall look good, work on increasing HDL, fasting glucose elevated but normal A1C. Discussed healthy eating and nutrition, intentional weight loss noted

## 2014-10-26 ENCOUNTER — Ambulatory Visit (INDEPENDENT_AMBULATORY_CARE_PROVIDER_SITE_OTHER): Payer: Managed Care, Other (non HMO) | Admitting: Family Medicine

## 2014-10-26 ENCOUNTER — Encounter: Payer: Self-pay | Admitting: Family Medicine

## 2014-10-26 VITALS — BP 130/74 | HR 80 | Temp 97.6°F | Resp 18 | Ht 68.0 in | Wt 172.0 lb

## 2014-10-26 DIAGNOSIS — J019 Acute sinusitis, unspecified: Secondary | ICD-10-CM | POA: Diagnosis not present

## 2014-10-26 DIAGNOSIS — F17200 Nicotine dependence, unspecified, uncomplicated: Secondary | ICD-10-CM

## 2014-10-26 DIAGNOSIS — Z720 Tobacco use: Secondary | ICD-10-CM | POA: Diagnosis not present

## 2014-10-26 DIAGNOSIS — J209 Acute bronchitis, unspecified: Secondary | ICD-10-CM | POA: Diagnosis not present

## 2014-10-26 MED ORDER — GUAIFENESIN-CODEINE 100-10 MG/5ML PO SOLN: 5.0000 mL | Freq: Four times a day (QID) | ORAL | Status: DC | PRN

## 2014-10-26 MED ORDER — AZITHROMYCIN 250 MG PO TABS: ORAL_TABLET | ORAL | Status: DC

## 2014-10-26 MED ORDER — FLUTICASONE PROPIONATE 50 MCG/ACT NA SUSP: 2.0000 | Freq: Every day | NASAL | Status: DC

## 2014-10-26 NOTE — Progress Notes (Signed)
Patient ID: Donald Hale, male   DOB: 1955-03-01, 59 y.o.   MRN: [id number]   Subjective:    Patient ID: Donald Hale, male    DOB: October 10, 1955, 59 y.o.   MRN: [id number]  Patient presents for Illness  patient here with sore throat for 2 weeks but then progressed to sinus pressure drainage now with cough and occasional wheeze. He is a smoker. He has not had a significant shortness of breath but his cough is worse at night. He has been taking Alka-Seltzer. He does smoke. His cough has mild production.    Review Of Systems:  GEN- + fatigue, fever, weight loss,weakness, recent illness HEENT- denies eye drainage, change in vision, +nasal discharge, CVS- denies chest pain, palpitations RESP- denies SOB, +cough, +wheeze ABD- denies N/V, change in stools, abd pain GU- denies dysuria, hematuria, dribbling, incontinence MSK- denies joint pain, muscle aches, injury Neuro- denies headache, dizziness, syncope, seizure activity       Objective:    BP 130/74 mmHg  Pulse 80  Temp(Src) 97.6 F (36.4 C) (Oral)  Resp 18  Ht  (1.727 m)  Wt 172 lb (78.019 kg)  BMI 26.16 kg/m2  SpO2 98% GEN- NAD, alert and oriented x3 HEENT- PERRL, EOMI, non injected sclera, pink conjunctiva, MMM, oropharynx mild injection, TM clear bilat no effusion,  mild maxillary sinus tenderness, inflammed turbinates,  Nasal drainage  Neck- Supple, no LAD CVS- RRR, no murmur RESP-few scattered wheeze, normal WOB, no rales, course BS no retractions, normal oxygen sat  EXT- No edema Pulses- Radial 2+         Assessment & Plan:      Problem List Items Addressed This Visit    Tobacco use disorder - Primary    Other Visit Diagnoses    Acute bronchitis, unspecified organism        Treat for bronchitis, also has some sinusitis with history of chronic sinusitis, zpak, cough medicine, flonase given, reiterated need for tobacco cessation    Acute rhinosinusitis        Relevant Medications    azithromycin  (ZITHROMAX) 250 MG tablet    fluticasone (FLONASE) 50 MCG/ACT nasal spray    guaiFENesin-codeine 100-10 MG/5ML syrup       Note: This dictation was prepared with Dragon dictation along with smaller phrase technology. Any transcriptional errors that result from this process are unintentional.

## 2014-10-26 NOTE — Patient Instructions (Signed)
Take antibiotics as prescribed Cough medicine Flonase for sinus drainage F/U as previous

## 2014-12-07 ENCOUNTER — Ambulatory Visit: Payer: Managed Care, Other (non HMO) | Admitting: Family Medicine

## 2015-06-18 ENCOUNTER — Other Ambulatory Visit: Payer: Self-pay | Admitting: Family Medicine

## 2015-06-19 ENCOUNTER — Other Ambulatory Visit: Payer: Self-pay | Admitting: *Deleted

## 2015-06-19 MED ORDER — AMLODIPINE BESYLATE 10 MG PO TABS: 10.0000 mg | ORAL_TABLET | Freq: Every day | ORAL | Status: DC

## 2015-06-19 NOTE — Telephone Encounter (Signed)
Patient fiance in office and requested refill on Norvasc.   Refill appropriate and filled per protocol.

## 2015-08-26 IMAGING — CT CT HEAD W/O CM
1 of 2 series · 16 of 30 positions shown, 20 images · non-contrast
Comparison: None.

CLINICAL DATA: Hypertensive headache

EXAM:
CT HEAD WITHOUT CONTRAST
TECHNIQUE: Contiguous axial images were obtained from the base of the skull
through the vertex without intravenous contrast.

[Series 3: head 2.0 h70h · axial · 0.44mm/px · z∈[+1351,+1489]mm · 16 of 77 slices shown, 20 images]
[im 4/77  brain]
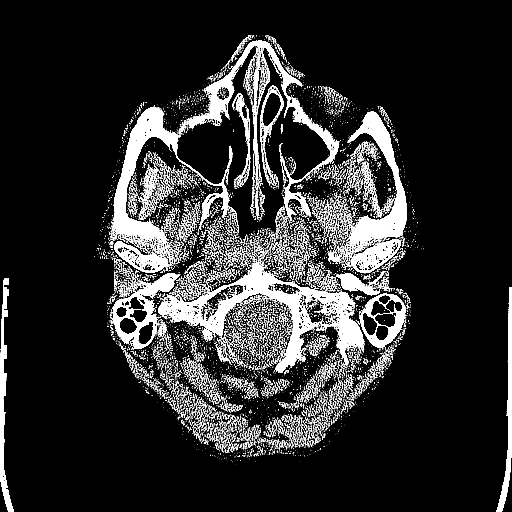
[im 4/77  bone]
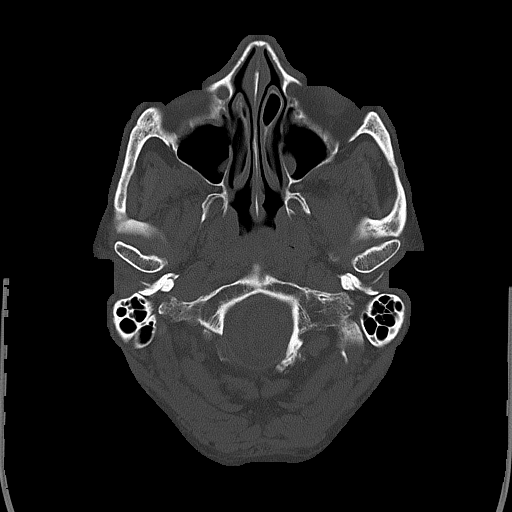
[im 8/77  brain]
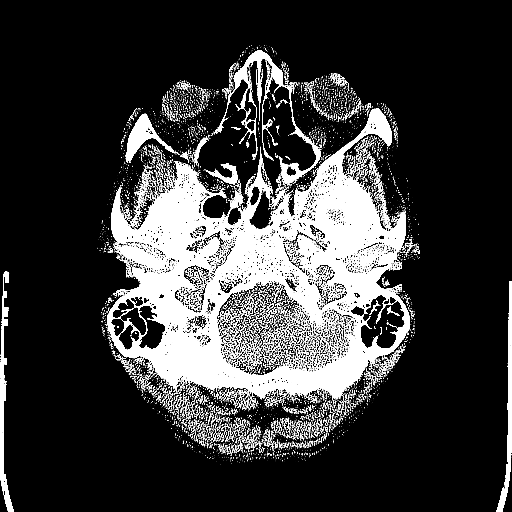
[im 12/77  brain]
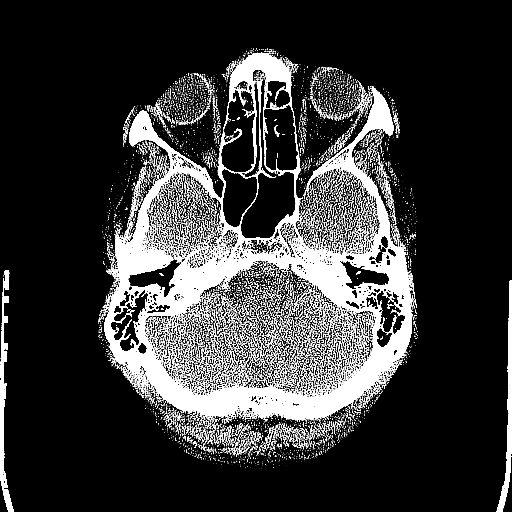
[im 20/77  brain]
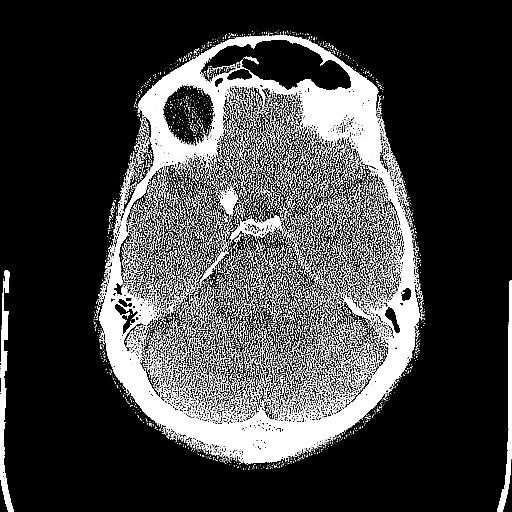
[im 23/77  brain]
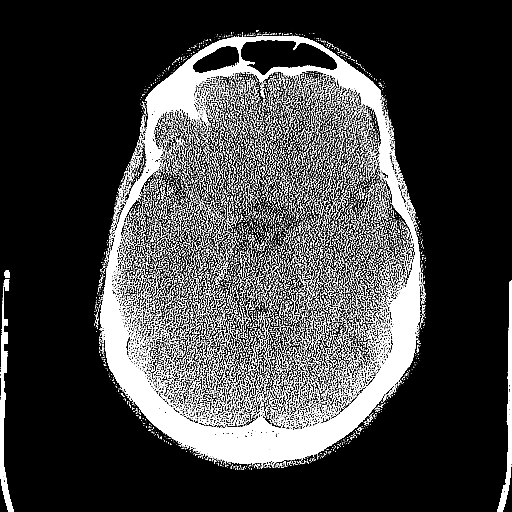
[im 23/77  bone]
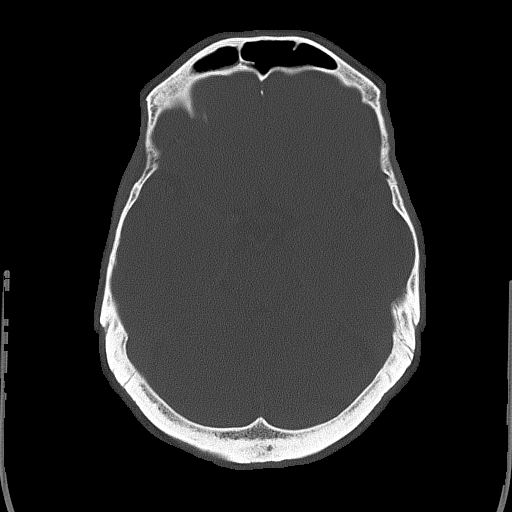
[im 27/77  brain]
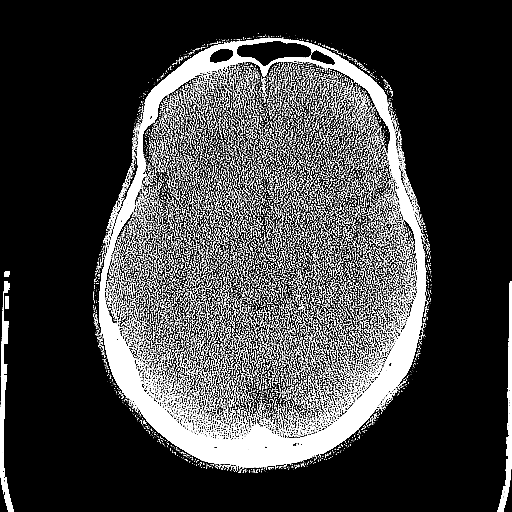
[im 31/77  brain]
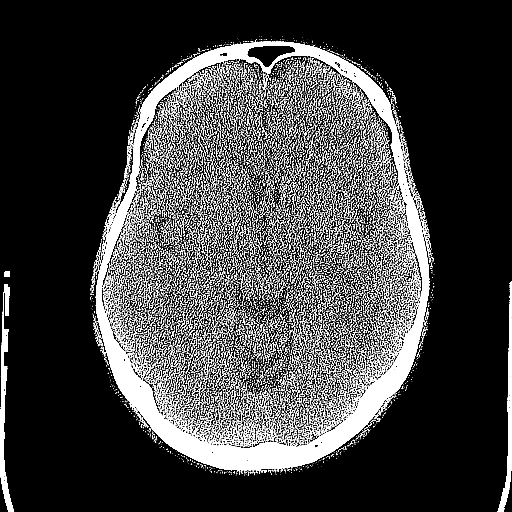
[im 35/77  brain]
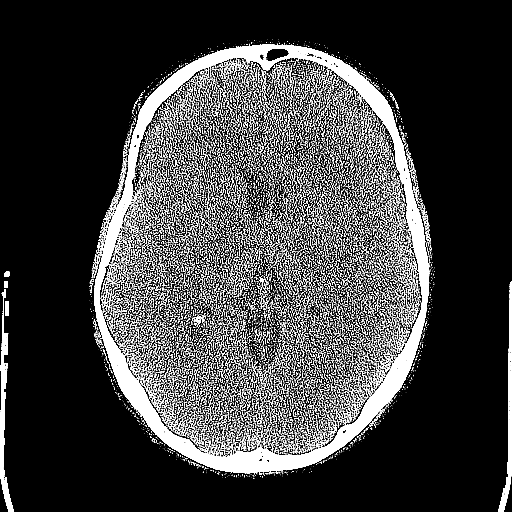
[im 42/77  brain]
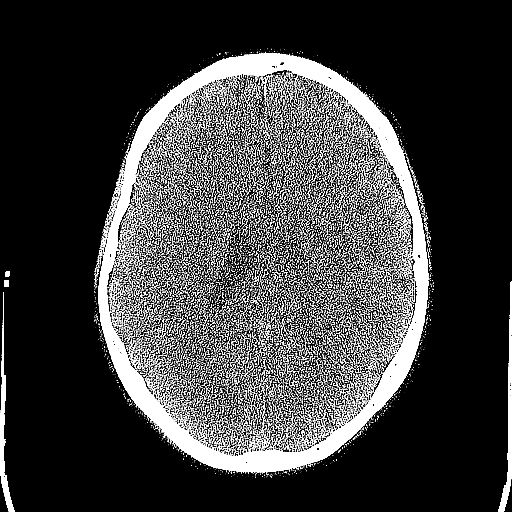
[im 42/77  bone]
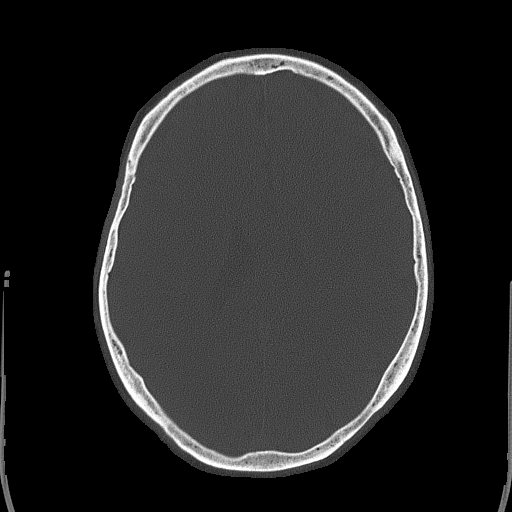
[im 46/77  brain]
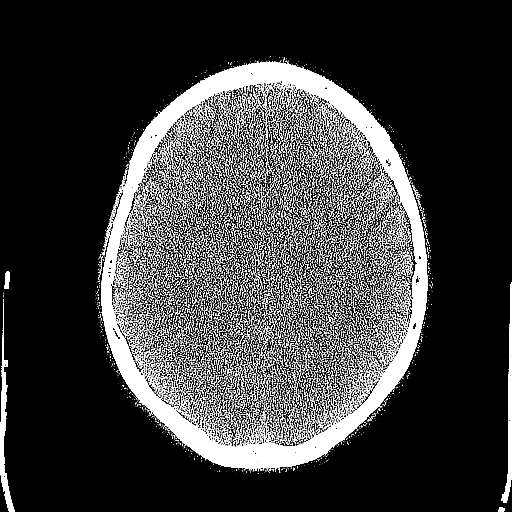
[im 50/77  brain]
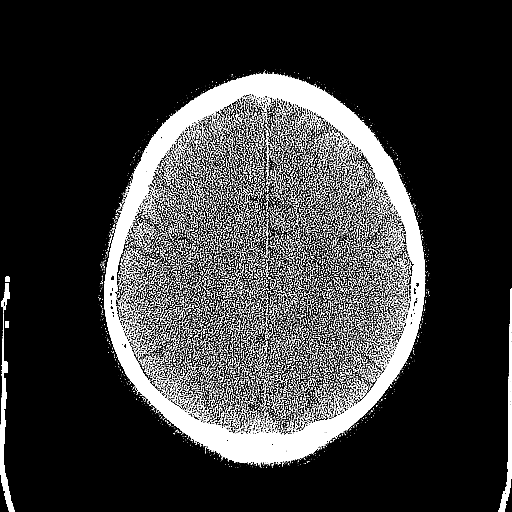
[im 54/77  brain]
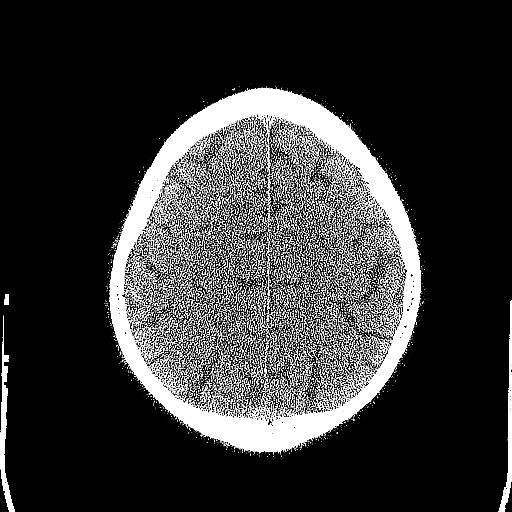
[im 58/77  brain]
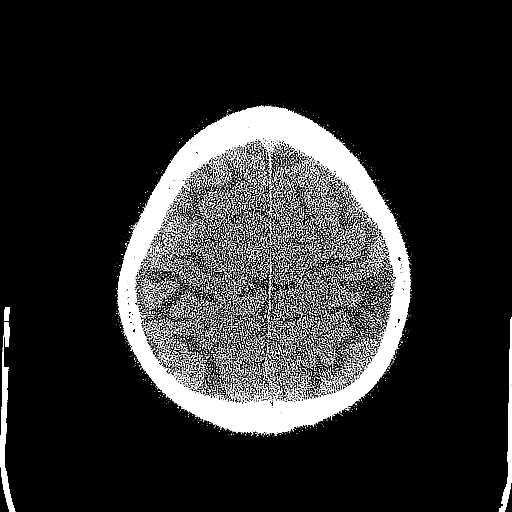
[im 58/77  bone]
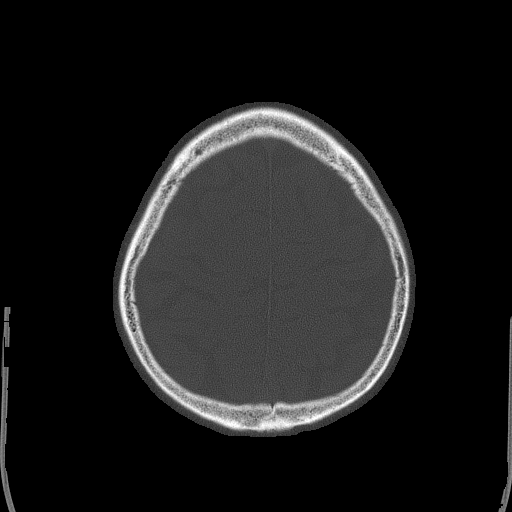
[im 65/77  brain]
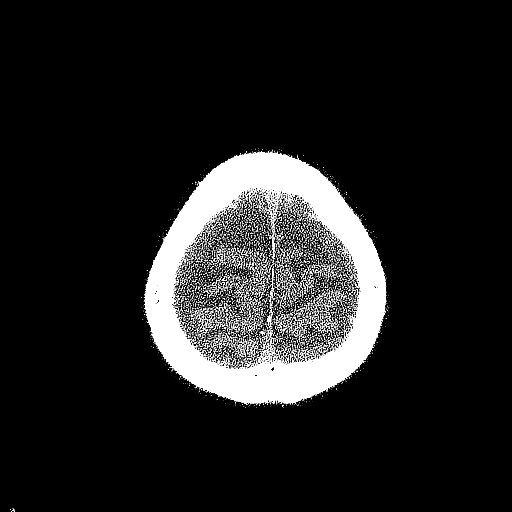
[im 69/77  brain]
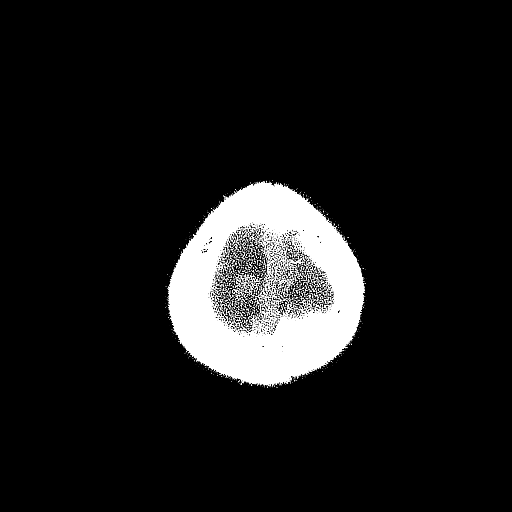
[im 73/77  brain]
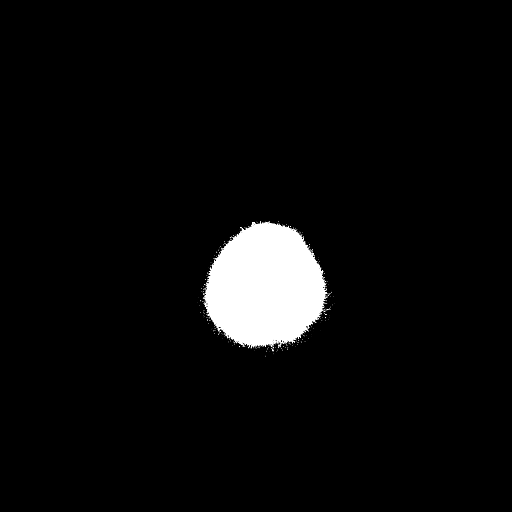

[16 of 30 positions shown; findings below may reference images not displayed]

FINDINGS: Sinuses/Soft tissues: Small mucous retention cyst or polyp in the
left maxillary sinus. Mucosal thickening in the right frontal sinus.
Clear mastoid air cells.

Intracranial: Mild cerebral atrophy. No mass lesion, hemorrhage,
hydrocephalus, acute infarct, intra-axial, or extra-axial fluid
collection.
IMPRESSION: 1.  No acute intracranial abnormality.
2. Sinus disease.

## 2015-08-26 IMAGING — CR DG CHEST 2V
2 series · 2 of 2 positions shown · non-contrast
Comparison: None.

CLINICAL DATA: Cough, chest pain

EXAM:
CHEST  2 VIEW

[w chest pa]
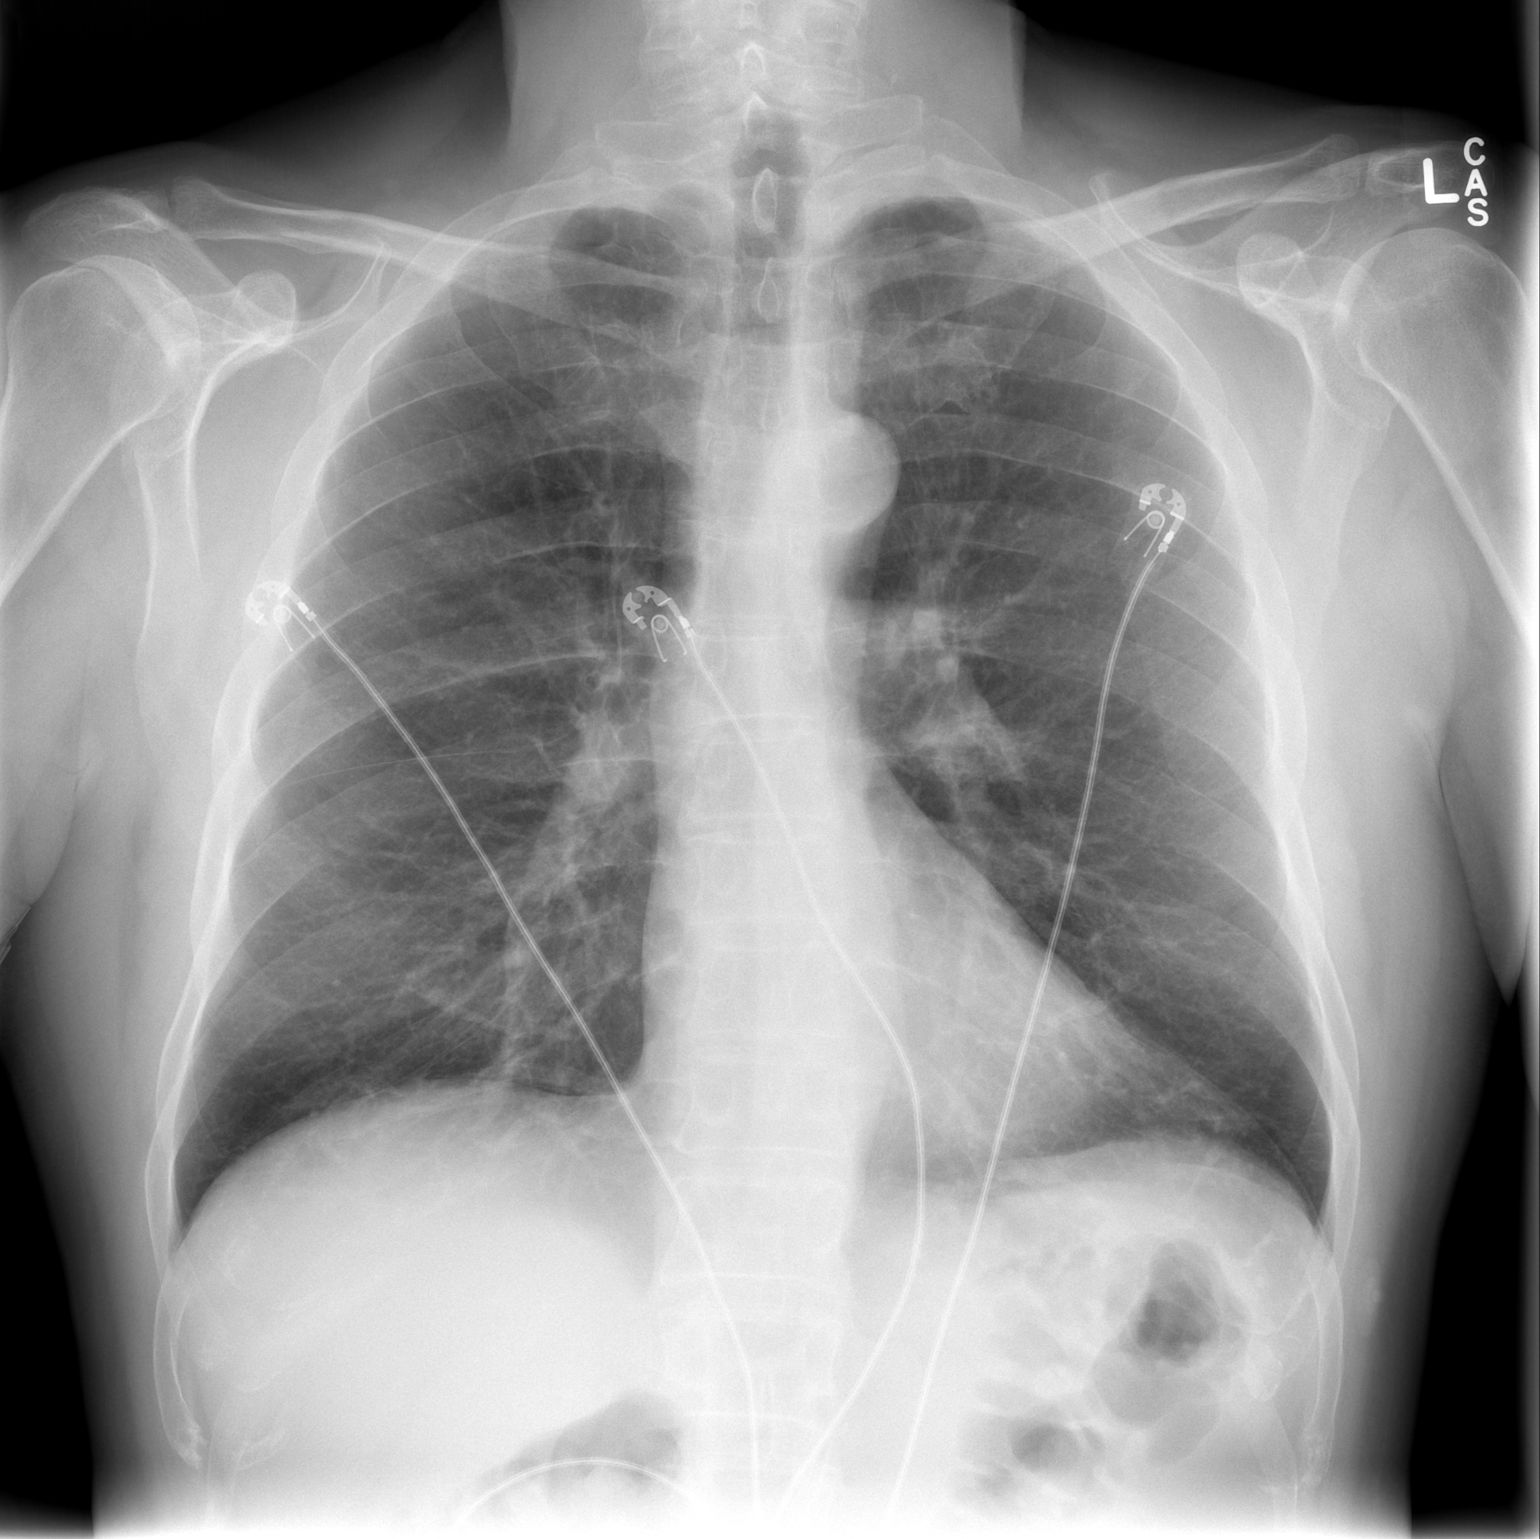

[w chest lat]
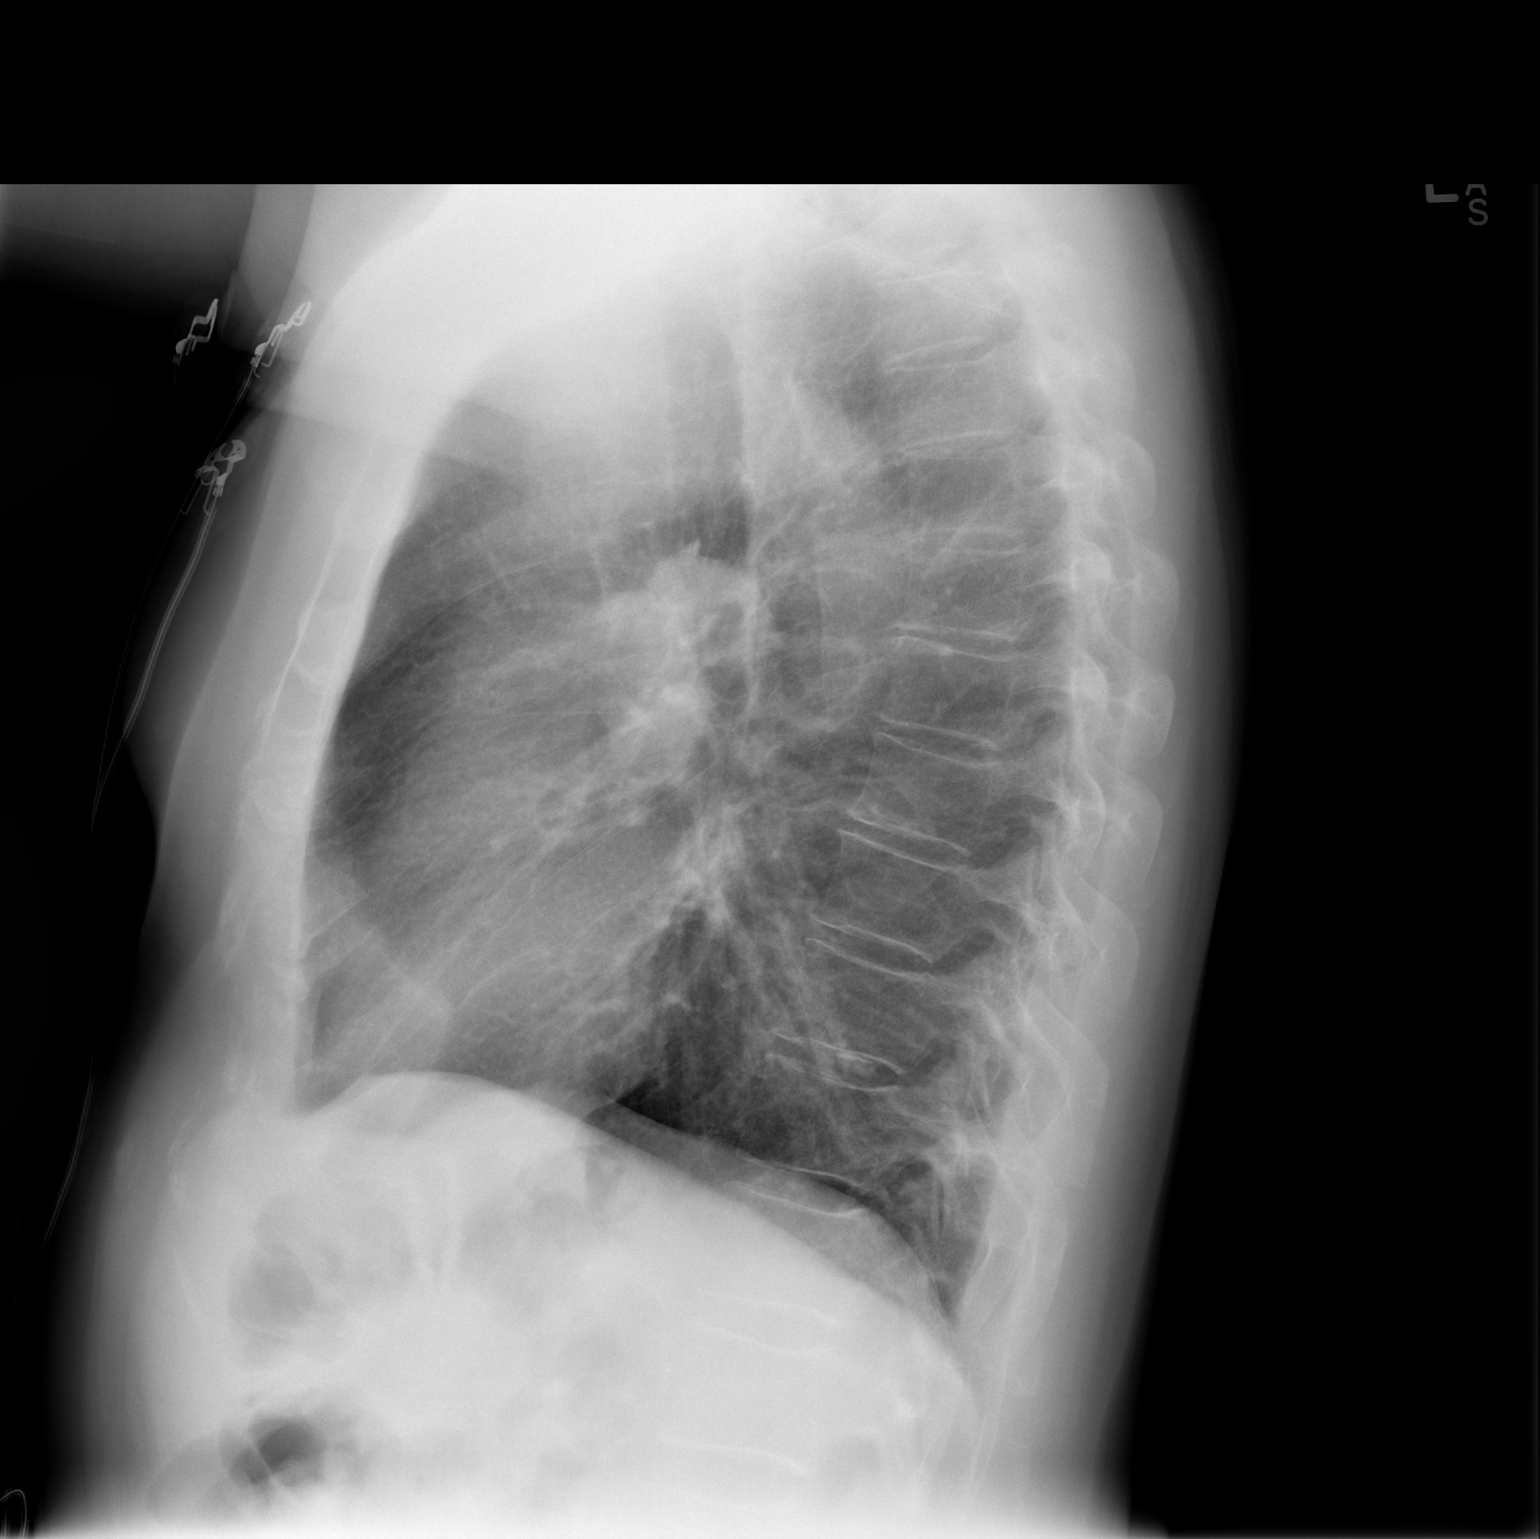

[2 of 2 positions shown; findings below may reference images not displayed]

FINDINGS: The heart size and mediastinal contours are within normal limits.
Both lungs are clear. The visualized skeletal structures are
unremarkable.
IMPRESSION: No active cardiopulmonary disease.

## 2015-10-10 ENCOUNTER — Ambulatory Visit (INDEPENDENT_AMBULATORY_CARE_PROVIDER_SITE_OTHER): Payer: Managed Care, Other (non HMO) | Admitting: Family Medicine

## 2015-10-10 ENCOUNTER — Encounter: Payer: Self-pay | Admitting: Family Medicine

## 2015-10-10 VITALS — BP 124/72 | HR 68 | Temp 97.9°F | Wt 172.0 lb

## 2015-10-10 DIAGNOSIS — I10 Essential (primary) hypertension: Secondary | ICD-10-CM

## 2015-10-10 DIAGNOSIS — Z0000 Encounter for general adult medical examination without abnormal findings: Secondary | ICD-10-CM

## 2015-10-10 DIAGNOSIS — F17200 Nicotine dependence, unspecified, uncomplicated: Secondary | ICD-10-CM

## 2015-10-10 LAB — CBC WITH DIFFERENTIAL/PLATELET
BASOS PCT: 1 %
Basophils Absolute: 68 cells/uL (ref 0–200)
EOS PCT: 4 %
Eosinophils Absolute: 272 cells/uL (ref 15–500)
HCT: 46.6 % (ref 38.5–50.0)
Hemoglobin: 16.1 g/dL (ref 13.0–17.0)
LYMPHS PCT: 36 %
Lymphs Abs: 2448 cells/uL (ref 850–3900)
MCH: 30.4 pg (ref 27.0–33.0)
MCHC: 34.5 g/dL (ref 32.0–36.0)
MCV: 88.1 fL (ref 80.0–100.0)
MONOS PCT: 11 %
MPV: 9.2 fL (ref 7.5–12.5)
Monocytes Absolute: 748 cells/uL (ref 200–950)
NEUTROS PCT: 48 %
Neutro Abs: 3264 cells/uL (ref 1500–7800)
PLATELETS: 336 10*3/uL (ref 140–400)
RBC: 5.29 MIL/uL (ref 4.20–5.80)
RDW: 12.9 % (ref 11.0–15.0)
WBC: 6.8 10*3/uL (ref 3.8–10.8)

## 2015-10-10 LAB — LIPID PANEL
Cholesterol: 201 mg/dL — ABNORMAL HIGH (ref 125–200)
HDL: 37 mg/dL — AB (ref >=40)
LDL Cholesterol: 136 mg/dL — ABNORMAL HIGH (ref <130)
TRIGLYCERIDES: 138 mg/dL (ref <150)
Total CHOL/HDL Ratio: 5.4 Ratio — ABNORMAL HIGH (ref <=5.0)
VLDL: 28 mg/dL (ref <30)

## 2015-10-10 LAB — COMPREHENSIVE METABOLIC PANEL
ALBUMIN: 4.5 g/dL (ref 3.6–5.1)
ALK PHOS: 63 U/L (ref 40–115)
ALT: 10 U/L (ref 9–46)
AST: 18 U/L (ref 10–35)
BUN: 9 mg/dL (ref 7–25)
CHLORIDE: 105 mmol/L (ref 98–110)
CO2: 25 mmol/L (ref 20–31)
CREATININE: 0.9 mg/dL (ref 0.70–1.33)
Calcium: 9.4 mg/dL (ref 8.6–10.3)
Glucose, Bld: 82 mg/dL (ref 70–99)
POTASSIUM: 4.3 mmol/L (ref 3.5–5.3)
Sodium: 140 mmol/L (ref 135–146)
TOTAL PROTEIN: 7.1 g/dL (ref 6.1–8.1)
Total Bilirubin: 0.7 mg/dL (ref 0.2–1.2)

## 2015-10-10 NOTE — Progress Notes (Signed)
   Subjective:    Patient ID: Donald Hale, male    DOB: January 22, 1956, 60 y.o.   MRN: [id number]  Patient presents for Hypertension and Annual Exam Patient here for complete physical exam No concerns today. Medications reviewed. He declines colonoscopy, discuss hepatitis C screening HIV screening he declines low risk factors. Due for fasting labs today. Declines flu shot    Review Of Systems:  GEN- denies fatigue, fever, weight loss,weakness, recent illness HEENT- denies eye drainage, change in vision, nasal discharge, CVS- denies chest pain, palpitations RESP- denies SOB, cough, wheeze ABD- denies N/V, change in stools, abd pain GU- denies dysuria, hematuria, dribbling, incontinence MSK- denies joint pain, muscle aches, injury Neuro- denies headache, dizziness, syncope, seizure activity       Objective:    BP 124/72 (BP Location: Right Arm, Patient Position: Sitting, Cuff Size: Normal)   Pulse 68   Temp 97.9 F (36.6 C) (Oral)   Wt 172 lb (78 kg)   BMI 26.15 kg/m  GEN- NAD, alert and oriented x3 HEENT- PERRL, EOMI, non injected sclera, pink conjunctiva, MMM, oropharynx clear Neck- Supple, no thyromegaly CVS- RRR, no murmur RESP-CTAB ABD-NABS,soft,NT,ND GU- declined  EXT- No edema Pulses- Radial, DP- 2+        Assessment & Plan:      Problem List Items Addressed This Visit    Tobacco use disorder   Routine general medical examination at a health care facility     physical exam done. We discussed Cologuard testing we will see if this is covered by his insurance. Fasting labs done. Discussed tobacco cessation and he does not want to quit at this time. Declines flu shot       Relevant Orders   CBC with Differential/Platelet   Lipid panel   Comprehensive metabolic panel   Hemoglobin A1c   Essential hypertension, benign - Primary    Well controlled       Relevant Orders   CBC with Differential/Platelet   Lipid panel   Comprehensive metabolic panel     Other Visit Diagnoses   None.     Note: This dictation was prepared with Dragon dictation along with smaller phrase technology. Any transcriptional errors that result from this process are unintentional.

## 2015-10-10 NOTE — Assessment & Plan Note (Signed)
physical exam done. We discussed Cologuard testing we will see if this is covered by his insurance. Fasting labs done. Discussed tobacco cessation and he does not want to quit at this time. Declines flu shot

## 2015-10-10 NOTE — Patient Instructions (Signed)
We will set up Cologaurd to see if covered I recommend eye visit once a year I recommend dental visit every 6 months Goal is to  Exercise 30 minutes 5 days a week We will send a letter with lab results  F/U 1 year or as needed

## 2015-10-10 NOTE — Assessment & Plan Note (Signed)
Well controlled 

## 2015-10-11 LAB — HEMOGLOBIN A1C
HEMOGLOBIN A1C: 5.3 % (ref <5.7)
MEAN PLASMA GLUCOSE: 105 mg/dL

## 2016-02-25 ENCOUNTER — Other Ambulatory Visit: Payer: Self-pay | Admitting: Family Medicine

## 2016-06-11 ENCOUNTER — Ambulatory Visit (INDEPENDENT_AMBULATORY_CARE_PROVIDER_SITE_OTHER): Payer: Managed Care, Other (non HMO) | Admitting: Family Medicine

## 2016-06-11 ENCOUNTER — Encounter: Payer: Self-pay | Admitting: Family Medicine

## 2016-06-11 VITALS — BP 140/72 | HR 76 | Temp 98.9°F | Resp 18 | Ht 69.0 in | Wt 167.0 lb

## 2016-06-11 DIAGNOSIS — F17200 Nicotine dependence, unspecified, uncomplicated: Secondary | ICD-10-CM | POA: Diagnosis not present

## 2016-06-11 DIAGNOSIS — W57XXXA Bitten or stung by nonvenomous insect and other nonvenomous arthropods, initial encounter: Secondary | ICD-10-CM

## 2016-06-11 DIAGNOSIS — J301 Allergic rhinitis due to pollen: Secondary | ICD-10-CM | POA: Diagnosis not present

## 2016-06-11 MED ORDER — DOXYCYCLINE HYCLATE 100 MG PO TABS: 100.0000 mg | ORAL_TABLET | Freq: Two times a day (BID) | ORAL | 0 refills | Status: DC

## 2016-06-11 MED ORDER — FLUTICASONE PROPIONATE 50 MCG/ACT NA SUSP: 2.0000 | Freq: Every day | NASAL | 6 refills | Status: DC

## 2016-06-11 NOTE — Assessment & Plan Note (Signed)
Discussed cessation Trial of nicoderm patches OTC

## 2016-06-11 NOTE — Patient Instructions (Addendum)
F/U for Physical in Sept Take antibiotics Steroid shot given  Use allergy medication Get tobacco patches

## 2016-06-11 NOTE — Progress Notes (Signed)
   Subjective:    Patient ID: Donald Hale, male    DOB: 15-Jul-1955, 61 y.o.   MRN: [id number]  Patient presents for Insect Bite Is here with insect bite to his left hand. States that he moved a piece of wood out of the way as he was trying to kill a snake. A few hours later he noted tingling and swelling of his left hand. He's also had some redness and some mild warmth to it. He has not had any fever or chills no pain up his arm he has not noticed any streaking. The swelling is minimally improved since it occurred on Saturday. He is still able to use his hand is normal. He is not recall what bit him he did not notice a tick. He's also had some mild sinus drainage she is not happy as Flonase. He does continue to smoke. States he cannot afford the Chantix and he had reaction to the well be trimmed.   Review Of Systems:  GEN- denies fatigue, fever, weight loss,weakness, recent illness HEENT- denies eye drainage, change in vision,+ nasal discharge, CVS- denies chest pain, palpitations RESP- denies SOB, cough, wheeze ABD- denies N/V, change in stools, abd pain MSK- denies joint pain, muscle aches, injury Neuro- denies headache, dizziness, syncope, seizure activity       Objective:    BP 140/72   Pulse 76   Temp 98.9 F (37.2 C) (Oral)   Resp 18   Ht 5\' 9"  (1.753 m)   Wt 167 lb (75.8 kg)   SpO2 99%   BMI 24.66 kg/m  GEN- NAD, alert and oriented x3 HEENT- PERRL, EOMI, non injected sclera, pink conjunctiva, MMM, oropharynx clear,nares clear rhimorrhea , TM clear no effusion, no maxillary sinus tenderness  Neck- Supple, no LAD CVS- RRR, no murmur RESP-CTAB Ext- Left hand- mild swelling mid hand , to 3rd and 4th digits and upper wrist, + warmth  mild eythema, no bite mark seen, palm clear, NT , able to make fist   Pulse- Radial 2_          Assessment & Plan:      Problem List Items Addressed This Visit    Tobacco use disorder    Discussed cessation Trial of nicoderm  patches OTC       Other Visit Diagnoses    Bug bite with infection, initial encounter    -  Primary   Cover with doxycycline, concern for mild celluitis s/p bite, possible spider. No systemic symptoms or streaking    Allergic rhinitis due to pollen, unspecified seasonality       Flonase prn      Note: This dictation was prepared with Dragon dictation along with smaller phrase technology. Any transcriptional errors that result from this process are unintentional.

## 2016-10-28 ENCOUNTER — Ambulatory Visit (INDEPENDENT_AMBULATORY_CARE_PROVIDER_SITE_OTHER): Payer: Managed Care, Other (non HMO) | Admitting: Family Medicine

## 2016-10-28 ENCOUNTER — Encounter: Payer: Self-pay | Admitting: Family Medicine

## 2016-10-28 VITALS — BP 120/74 | HR 78 | Temp 98.8°F | Resp 16 | Ht 69.0 in | Wt 170.0 lb

## 2016-10-28 DIAGNOSIS — I10 Essential (primary) hypertension: Secondary | ICD-10-CM | POA: Diagnosis not present

## 2016-10-28 DIAGNOSIS — Z0000 Encounter for general adult medical examination without abnormal findings: Secondary | ICD-10-CM | POA: Diagnosis not present

## 2016-10-28 DIAGNOSIS — F17200 Nicotine dependence, unspecified, uncomplicated: Secondary | ICD-10-CM

## 2016-10-28 DIAGNOSIS — Z125 Encounter for screening for malignant neoplasm of prostate: Secondary | ICD-10-CM

## 2016-10-28 MED ORDER — AMLODIPINE BESYLATE 10 MG PO TABS: 10.0000 mg | ORAL_TABLET | Freq: Every day | ORAL | 2 refills | Status: DC

## 2016-10-28 MED ORDER — FLUTICASONE PROPIONATE 50 MCG/ACT NA SUSP: 2.0000 | Freq: Every day | NASAL | 6 refills | Status: DC

## 2016-10-28 NOTE — Assessment & Plan Note (Signed)
CPE done, I will have nurse look into cologuard records Return for fasting labs Weight looks good

## 2016-10-28 NOTE — Patient Instructions (Addendum)
Return for fasting labs- evening appointment  F/U 1 year Physical

## 2016-10-28 NOTE — Progress Notes (Signed)
   Subjective:    Patient ID: Donald Hale, male    DOB: 22-Oct-1955, 61 y.o.   MRN: [id number]  Patient presents for Annual Exam and Medication Refill  Pt here for annual exam Doing well no concern   Taking blood pressure medication as prescribed Declines colonoscopy - he did stool cards last year but I dont see the results Declines immunizations History reviewed Due for fasting labs      Review Of Systems:  GEN- denies fatigue, fever, weight loss,weakness, recent illness HEENT- denies eye drainage, change in vision, nasal discharge, CVS- denies chest pain, palpitations RESP- denies SOB, cough, wheeze ABD- denies N/V, change in stools, abd pain GU- denies dysuria, hematuria, dribbling, incontinence MSK- denies joint pain, muscle aches, injury Neuro- denies headache, dizziness, syncope, seizure activity       Objective:    BP 120/74   Pulse 78   Temp 98.8 F (37.1 C) (Oral)   Resp 16   Ht  (1.753 m)   Wt 170 lb (77.1 kg)   SpO2 97%   BMI 25.10 kg/m  GEN- NAD, alert and oriented x3 HEENT- PERRL, EOMI, non injected sclera, pink conjunctiva, MMM, oropharynx clear,TM clear bilat, no effusion Neck- Supple, no thyromegaly CVS- RRR, no murmur RESP-CTAB ABD-NABS,soft,NT,ND GU- deferred EXT- No edema Pulses- Radial, DP- 2+        Assessment & Plan:      Problem List Items Addressed This Visit      Unprioritized   Tobacco use disorder    counsled on cessation, he is not ready to quit       Routine general medical examination at a health care facility - Primary    CPE done, I will have nurse look into cologuard records Return for fasting labs Weight looks good       Essential hypertension, benign    Well controlled, continue norvasc       Relevant Medications   amLODipine (NORVASC) 10 MG tablet      Note: This dictation was prepared with Dragon dictation along with smaller phrase technology. Any transcriptional errors that result from this  process are unintentional.

## 2016-10-28 NOTE — Assessment & Plan Note (Signed)
Well controlled, continue norvasc ?

## 2016-10-28 NOTE — Assessment & Plan Note (Signed)
counsled on cessation , he is not ready to quit 

## 2016-11-01 ENCOUNTER — Other Ambulatory Visit: Payer: Managed Care, Other (non HMO)

## 2016-11-01 DIAGNOSIS — Z125 Encounter for screening for malignant neoplasm of prostate: Secondary | ICD-10-CM

## 2016-11-01 DIAGNOSIS — Z0000 Encounter for general adult medical examination without abnormal findings: Secondary | ICD-10-CM

## 2016-11-02 LAB — CBC WITH DIFFERENTIAL/PLATELET
Basophils Absolute: 69 cells/uL (ref 0–200)
Basophils Relative: 0.9 %
EOS ABS: 262 {cells}/uL (ref 15–500)
Eosinophils Relative: 3.4 %
HCT: 41.7 % (ref 38.5–50.0)
Hemoglobin: 14.6 g/dL (ref 13.2–17.1)
Lymphs Abs: 2649 cells/uL (ref 850–3900)
MCH: 30.5 pg (ref 27.0–33.0)
MCHC: 35 g/dL (ref 32.0–36.0)
MCV: 87.2 fL (ref 80.0–100.0)
MPV: 10.1 fL (ref 7.5–12.5)
Monocytes Relative: 10.3 %
NEUTROS PCT: 51 %
Neutro Abs: 3927 cells/uL (ref 1500–7800)
PLATELETS: 335 10*3/uL (ref 140–400)
RBC: 4.78 10*6/uL (ref 4.20–5.80)
RDW: 12.1 % (ref 11.0–15.0)
TOTAL LYMPHOCYTE: 34.4 %
WBC: 7.7 10*3/uL (ref 3.8–10.8)
WBCMIX: 793 {cells}/uL (ref 200–950)

## 2016-11-02 LAB — LIPID PANEL
CHOL/HDL RATIO: 5.3 (calc) — AB (ref <5.0)
CHOLESTEROL: 171 mg/dL (ref <200)
HDL: 32 mg/dL — ABNORMAL LOW (ref >40)
LDL Cholesterol (Calc): 110 mg/dL (calc) — ABNORMAL HIGH
Non-HDL Cholesterol (Calc): 139 mg/dL (calc) — ABNORMAL HIGH (ref <130)
Triglycerides: 171 mg/dL — ABNORMAL HIGH (ref <150)

## 2016-11-02 LAB — COMPREHENSIVE METABOLIC PANEL
AG Ratio: 1.6 (calc) (ref 1.0–2.5)
ALBUMIN MSPROF: 4.1 g/dL (ref 3.6–5.1)
ALKALINE PHOSPHATASE (APISO): 58 U/L (ref 40–115)
ALT: 10 U/L (ref 9–46)
AST: 15 U/L (ref 10–35)
BILIRUBIN TOTAL: 0.5 mg/dL (ref 0.2–1.2)
BUN: 12 mg/dL (ref 7–25)
CALCIUM: 9.1 mg/dL (ref 8.6–10.3)
CHLORIDE: 105 mmol/L (ref 98–110)
CO2: 28 mmol/L (ref 20–32)
CREATININE: 1.2 mg/dL (ref 0.70–1.25)
GLOBULIN: 2.6 g/dL (ref 1.9–3.7)
Glucose, Bld: 92 mg/dL (ref 65–99)
POTASSIUM: 3.9 mmol/L (ref 3.5–5.3)
SODIUM: 140 mmol/L (ref 135–146)
TOTAL PROTEIN: 6.7 g/dL (ref 6.1–8.1)

## 2016-11-02 LAB — PSA: PSA: 0.5 ng/mL (ref <=4.0)

## 2016-11-04 ENCOUNTER — Telehealth: Payer: Self-pay | Admitting: Family Medicine

## 2016-11-04 ENCOUNTER — Encounter: Payer: Self-pay | Admitting: *Deleted

## 2016-11-04 MED ORDER — AMLODIPINE BESYLATE 10 MG PO TABS
10.0000 mg | ORAL_TABLET | Freq: Every day | ORAL | 3 refills | Status: DC
Start: 2016-11-04 — End: 2017-10-01

## 2016-11-04 NOTE — Telephone Encounter (Signed)
Medication called/sent to requested pharmacy  

## 2016-11-04 NOTE — Telephone Encounter (Signed)
Refill on amlodipine [facility] [address] [place].

## 2017-10-01 ENCOUNTER — Ambulatory Visit: Payer: Managed Care, Other (non HMO) | Admitting: Family Medicine

## 2017-10-01 ENCOUNTER — Encounter: Payer: Self-pay | Admitting: Family Medicine

## 2017-10-01 ENCOUNTER — Other Ambulatory Visit: Payer: Self-pay

## 2017-10-01 VITALS — BP 110/68 | HR 68 | Temp 98.6°F | Resp 14 | Ht 69.0 in | Wt 170.0 lb

## 2017-10-01 DIAGNOSIS — I10 Essential (primary) hypertension: Secondary | ICD-10-CM

## 2017-10-01 DIAGNOSIS — J209 Acute bronchitis, unspecified: Secondary | ICD-10-CM | POA: Diagnosis not present

## 2017-10-01 DIAGNOSIS — F17200 Nicotine dependence, unspecified, uncomplicated: Secondary | ICD-10-CM

## 2017-10-01 MED ORDER — PREDNISONE 20 MG PO TABS: 40.0000 mg | ORAL_TABLET | Freq: Every day | ORAL | 0 refills | Status: AC

## 2017-10-01 MED ORDER — BENZONATATE 100 MG PO CAPS: 100.0000 mg | ORAL_CAPSULE | Freq: Three times a day (TID) | ORAL | 0 refills | Status: DC | PRN

## 2017-10-01 MED ORDER — AMLODIPINE BESYLATE 10 MG PO TABS: 10.0000 mg | ORAL_TABLET | Freq: Every day | ORAL | 3 refills | Status: DC

## 2017-10-01 MED ORDER — ALBUTEROL SULFATE HFA 108 (90 BASE) MCG/ACT IN AERS: 2.0000 | INHALATION_SPRAY | RESPIRATORY_TRACT | 0 refills | Status: DC | PRN

## 2017-10-01 NOTE — Patient Instructions (Signed)
Keep doing mucinex  Follow up with me in 1-2 weeks if not improving  Cut back on smoking as much as you can

## 2017-10-01 NOTE — Progress Notes (Signed)
Patient ID: Rolondo Pierre, male    DOB: 1955/09/28, 62 y.o.   MRN: [id number]  PCP: Salley Scarlet, MD  Chief Complaint  Patient presents with  . Medication Refills    is not fasting    Subjective:   Oakley Kossman is a 62 y.o. male, presents to clinic with CC of HTN and needs meds refilled.  Last seen 10/28/2016 for CPE with labs and med refills.  He reports checking his BP at home "it usually runs about 120's", but he hasn't checked it in a while.  He doesn't feel like he needs norvasc, but states he is taking it as prescribed by Dr. Jeanice Lim.  He denies any SE.  No LE swelling, CP, syncope, HA, visual disturbances.    Pt also has roughly one week of worse than baseline productive cough with wheeze.   He is a current smoker, has no desire to quit.  He's had inhalers in the past, would like another albuterol inhaler.  No fever, sweats, weightloss.  Patient Active Problem List   Diagnosis Date Noted  . Erectile dysfunction 11/02/2013  . Sinusitis, chronic 09/27/2013  . Essential hypertension, benign 09/27/2013  . Routine general medical examination at a health care facility 08/27/2013  . Tobacco use disorder 08/27/2013     Prior to Admission medications   Medication Sig Start Date End Date Taking? Authorizing Provider  amLODipine (NORVASC) 10 MG tablet Take 1 tablet (10 mg total) by mouth daily. 11/04/16  Yes Donita Brooks, MD  fluticasone (FLONASE) 50 MCG/ACT nasal spray Place 2 sprays into both nostrils daily. 10/28/16  Yes [place], Velna Hatchet, MD  Multiple Vitamin (MULTIVITAMIN WITH MINERALS) TABS tablet Take 1 tablet by mouth daily.   Yes [provider]     Allergies  Allergen Reactions  . Zyban [Bupropion]     Headache, CP     Family History  Problem Relation Age of Onset  . Heart disease Mother      Social History   Socioeconomic History  . Marital status: Widowed    Spouse name: Not on file  . Number of children: Not on file  . Years of  education: Not on file  . Highest education level: Not on file  Occupational History  . Not on file  Social Needs  . Financial resource strain: Not on file  . Food insecurity:    Worry: Not on file    Inability: Not on file  . Transportation needs:    Medical: Not on file    Non-medical: Not on file  Tobacco Use  . Smoking status: Current Every Day Smoker    Packs/day: 2.00    Types: Cigarettes  . Smokeless tobacco: Never Used  Substance and Sexual Activity  . Alcohol use: Not Currently  . Drug use: No  . Sexual activity: Yes  Lifestyle  . Physical activity:    Days per week: Not on file    Minutes per session: Not on file  . Stress: Not on file  Relationships  . Social connections:    Talks on phone: Not on file    Gets together: Not on file    Attends religious service: Not on file    Active member of club or organization: Not on file    Attends meetings of clubs or organizations: Not on file    Relationship status: Not on file  . Intimate partner violence:    Fear of current or ex partner: Not on file  Emotionally abused: Not on file    Physically abused: Not on file    Forced sexual activity: Not on file  Other Topics Concern  . Not on file  Social History Narrative  . Not on file     Review of Systems  Constitutional: Negative.  Negative for activity change, appetite change, chills, diaphoresis, fatigue, fever and unexpected weight change.  HENT: Negative.   Eyes: Negative.   Respiratory: Positive for cough, chest tightness, shortness of breath and wheezing. Negative for apnea, choking and stridor.   Cardiovascular: Negative.  Negative for chest pain, palpitations and leg swelling.  Gastrointestinal: Negative.   Endocrine: Negative.   Genitourinary: Negative.   Musculoskeletal: Negative.   Skin: Negative.   Allergic/Immunologic: Negative.   Neurological: Negative.  Negative for syncope, light-headedness and numbness.  Hematological: Negative.     Psychiatric/Behavioral: Negative.   All other systems reviewed and are negative.      Objective:    Vitals:   10/01/17 1614  BP: 110/68  Pulse: 68  Resp: 14  Temp: 98.6 F (37 C)  TempSrc: Oral  SpO2: 95%  Weight: 170 lb (77.1 kg)  Height: 5\' 9"  (1.753 m)      Physical Exam  Constitutional: He is oriented to person, place, and time. He appears well-developed and well-nourished.  Non-toxic appearance. He does not appear ill. No distress.  HENT:  Head: Normocephalic and atraumatic.  Right Ear: Tympanic membrane, external ear and ear canal normal.  Left Ear: Tympanic membrane, external ear and ear canal normal.  Nose: Nose normal. No mucosal edema or rhinorrhea. Right sinus exhibits no maxillary sinus tenderness and no frontal sinus tenderness. Left sinus exhibits no maxillary sinus tenderness and no frontal sinus tenderness.  Mouth/Throat: Uvula is midline and oropharynx is clear and moist. No trismus in the jaw. No uvula swelling. No oropharyngeal exudate, posterior oropharyngeal edema or posterior oropharyngeal erythema.  Eyes: Pupils are equal, round, and reactive to light. Conjunctivae, EOM and lids are normal. No scleral icterus.  Neck: Trachea normal, normal range of motion and phonation normal. Neck supple. No tracheal deviation present.  Cardiovascular: Normal rate, regular rhythm, normal heart sounds, intact distal pulses and normal pulses. Exam reveals no gallop and no friction rub.  No murmur heard. Pulses:      Radial pulses are 2+ on the right side, and 2+ on the left side.       Posterior tibial pulses are 2+ on the right side, and 2+ on the left side.  No LE edema  Pulmonary/Chest: Effort normal. No stridor. No respiratory distress. He has wheezes. He has no rhonchi. He has no rales.  Abdominal: Soft. Normal appearance and bowel sounds are normal. He exhibits no distension. There is no tenderness. There is no rebound and no guarding.  Musculoskeletal: Normal  range of motion. He exhibits no edema.  Neurological: He is alert and oriented to person, place, and time. He exhibits normal muscle tone. Coordination and gait normal.  Skin: Skin is warm, dry and intact. Capillary refill takes less than 2 seconds. No rash noted. He is not diaphoretic.  Psychiatric: He has a normal mood and affect. His speech is normal and behavior is normal.  Nursing note and vitals reviewed.         Assessment & Plan:      ICD-10-CM   1. Essential hypertension, benign I10 amLODipine (NORVASC) 10 MG tablet   BP at goal, no SE reported.   refills sent  2. Acute  bronchitis, unspecified organism J20.9 predniSONE (DELTASONE) 20 MG tablet    benzonatate (TESSALON) 100 MG capsule  3. Tobacco use disorder F17.200     Inhaler sent to pharmacy.  Pt to continue mucinex, encouraged to cut back on smoking, f/up if not improving.     Danelle Berry, PA-C 10/01/17 4:26 PM

## 2017-10-03 ENCOUNTER — Telehealth: Payer: Self-pay | Admitting: *Deleted

## 2017-10-03 MED ORDER — ALBUTEROL SULFATE HFA 108 (90 BASE) MCG/ACT IN AERS: 2.0000 | INHALATION_SPRAY | RESPIRATORY_TRACT | 1 refills | Status: DC | PRN

## 2017-10-03 NOTE — Telephone Encounter (Signed)
Inhaler changed per insurance coverage

## 2017-10-03 NOTE — Telephone Encounter (Signed)
Received fax requesting alternative to Proventil.   States that ProAir is preferred.   Ok to change?

## 2018-09-04 ENCOUNTER — Other Ambulatory Visit: Payer: Self-pay

## 2018-09-07 ENCOUNTER — Encounter: Payer: Self-pay | Admitting: Family Medicine

## 2018-09-07 ENCOUNTER — Other Ambulatory Visit: Payer: Self-pay

## 2018-09-07 ENCOUNTER — Ambulatory Visit: Payer: Managed Care, Other (non HMO) | Admitting: Family Medicine

## 2018-09-07 VITALS — BP 112/64 | HR 74 | Temp 98.8°F | Resp 14 | Ht 69.0 in | Wt 173.0 lb

## 2018-09-07 DIAGNOSIS — R319 Hematuria, unspecified: Secondary | ICD-10-CM

## 2018-09-07 DIAGNOSIS — I10 Essential (primary) hypertension: Secondary | ICD-10-CM

## 2018-09-07 DIAGNOSIS — K5900 Constipation, unspecified: Secondary | ICD-10-CM

## 2018-09-07 DIAGNOSIS — Z125 Encounter for screening for malignant neoplasm of prostate: Secondary | ICD-10-CM

## 2018-09-07 DIAGNOSIS — F17200 Nicotine dependence, unspecified, uncomplicated: Secondary | ICD-10-CM

## 2018-09-07 LAB — URINALYSIS, ROUTINE W REFLEX MICROSCOPIC
Bacteria, UA: NONE SEEN /HPF
Bilirubin Urine: NEGATIVE
Glucose, UA: NEGATIVE
Hyaline Cast: NONE SEEN /LPF
Ketones, ur: NEGATIVE
Leukocytes,Ua: NEGATIVE
Nitrite: NEGATIVE
Protein, ur: NEGATIVE
Specific Gravity, Urine: 1.025 (ref 1.001–1.03)
Squamous Epithelial / LPF: NONE SEEN /HPF (ref <=5)
WBC, UA: NONE SEEN /HPF (ref 0–5)
pH: 6.5 (ref 5.0–8.0)

## 2018-09-07 LAB — MICROSCOPIC MESSAGE

## 2018-09-07 MED ORDER — LINACLOTIDE 72 MCG PO CAPS: 72.0000 ug | ORAL_CAPSULE | Freq: Every day | ORAL | 0 refills | Status: DC

## 2018-09-07 MED ORDER — FLUTICASONE PROPIONATE 50 MCG/ACT NA SUSP: 2.0000 | Freq: Every day | NASAL | 6 refills | Status: DC

## 2018-09-07 MED ORDER — ALBUTEROL SULFATE HFA 108 (90 BASE) MCG/ACT IN AERS: 2.0000 | INHALATION_SPRAY | RESPIRATORY_TRACT | 1 refills | Status: DC | PRN

## 2018-09-07 MED ORDER — AMLODIPINE BESYLATE 10 MG PO TABS: 10.0000 mg | ORAL_TABLET | Freq: Every day | ORAL | 3 refills | Status: DC

## 2018-09-07 NOTE — Assessment & Plan Note (Signed)
counsled on cessation, if blood does not resolve, would need urology referral, higher risk for bladder cancer with tobacco use

## 2018-09-07 NOTE — Progress Notes (Signed)
   Subjective:    Patient ID: Donald Hale, male    DOB: 03/12/1955, 63 y.o.   MRN: [id number]  Patient presents for Hematuria   Wed AM last week, took miralax since he had been a little constipated. When he got to work that morning urinated and had blood. Didn't feel any pain in back or bladder. By lunch no further blood. He took another dose miralax on Thursday, same thing. Stopped Miralax and no further blood since Having BM a few times a week which is his typical   He has been drinking [place] mostly  HTN- taking norvasc    Needs refill on albuterol, smoking 1ppd   Needs refill on flonase   [id number] exp 9/22 - for Linzess 72mg    Review Of Systems:  GEN- denies fatigue, fever, weight loss,weakness, recent illness HEENT- denies eye drainage, change in vision, nasal discharge, CVS- denies chest pain, palpitations RESP- denies SOB, cough, wheeze ABD- denies N/V, change in stools, abd pain GU- denies dysuria, +hematuria, dribbling, incontinence MSK- denies joint pain, muscle aches, injury Neuro- denies headache, dizziness, syncope, seizure activity       Objective:    BP 112/64   Pulse 74   Temp 98.8 F (37.1 C) (Oral)   Resp 14   Ht 5\' 9"  (1.753 m)   Wt 173 lb (78.5 kg)   SpO2 94%   BMI 25.55 kg/m  GEN- NAD, alert and oriented x3 HEENT- PERRL, EOMI, non injected sclera, pink conjunctiva, MMM, oropharynx clear CVS- RRR, no murmur RESP-CTAB ABD-NABS,soft,NT,ND, no CVA tenderness EXT- No edema Pulses- Radial, DP- 2+        Assessment & Plan:      Problem List Items Addressed This Visit      Unprioritized   Constipation    Increase water and fiber Given samples of linzess      Essential hypertension, benign    Controlled, return for fasting labs and renal function      Relevant Medications   amLODipine (NORVASC) 10 MG tablet   Tobacco use disorder    counsled on cessation, if blood does not resolve, would need urology referral, higher risk for  bladder cancer with tobacco use        Other Visit Diagnoses    Hematuria, unspecified type    -  Primary   microscopic today, no pain but has had kidney stones without any pain, if he does develop classic stone pain, start flomax, norco   Relevant Orders   Urinalysis, Routine w reflex microscopic (Completed)   Urine Culture      Note: This dictation was prepared with Dragon dictation along with smaller phrase technology. Any transcriptional errors that result from this process are unintentional.

## 2018-09-07 NOTE — Assessment & Plan Note (Signed)
Increase water and fiber Given samples of linzess

## 2018-09-07 NOTE — Assessment & Plan Note (Signed)
Controlled, return for fasting labs and renal function

## 2018-09-07 NOTE — Patient Instructions (Addendum)
Return for fasting labs this week - Wednesday  Linzess 72mg  once a day

## 2018-09-08 ENCOUNTER — Other Ambulatory Visit: Payer: Managed Care, Other (non HMO)

## 2018-09-08 DIAGNOSIS — I10 Essential (primary) hypertension: Secondary | ICD-10-CM

## 2018-09-08 DIAGNOSIS — R319 Hematuria, unspecified: Secondary | ICD-10-CM

## 2018-09-08 DIAGNOSIS — Z125 Encounter for screening for malignant neoplasm of prostate: Secondary | ICD-10-CM

## 2018-09-08 LAB — URINALYSIS, ROUTINE W REFLEX MICROSCOPIC
Bacteria, UA: NONE SEEN /HPF
Bilirubin Urine: NEGATIVE
Glucose, UA: NEGATIVE
Hyaline Cast: NONE SEEN /LPF
Ketones, ur: NEGATIVE
Leukocytes,Ua: NEGATIVE
Nitrite: NEGATIVE
Protein, ur: NEGATIVE
Specific Gravity, Urine: 1.02 (ref 1.001–1.03)
Squamous Epithelial / LPF: NONE SEEN /HPF (ref <=5)
WBC, UA: NONE SEEN /HPF (ref 0–5)
pH: 7 (ref 5.0–8.0)

## 2018-09-08 LAB — MICROSCOPIC MESSAGE

## 2018-09-09 ENCOUNTER — Other Ambulatory Visit: Payer: Managed Care, Other (non HMO)

## 2018-09-09 LAB — CBC WITH DIFFERENTIAL/PLATELET
Absolute Monocytes: 743 cells/uL (ref 200–950)
Basophils Absolute: 71 cells/uL (ref 0–200)
Basophils Relative: 0.9 %
Eosinophils Absolute: 316 cells/uL (ref 15–500)
Eosinophils Relative: 4 %
HCT: 48.2 % (ref 38.5–50.0)
Hemoglobin: 16.1 g/dL (ref 13.2–17.1)
Lymphs Abs: 2386 cells/uL (ref 850–3900)
MCH: 30.1 pg (ref 27.0–33.0)
MCHC: 33.4 g/dL (ref 32.0–36.0)
MCV: 90.3 fL (ref 80.0–100.0)
MPV: 9.9 fL (ref 7.5–12.5)
Monocytes Relative: 9.4 %
Neutro Abs: 4385 cells/uL (ref 1500–7800)
Neutrophils Relative %: 55.5 %
Platelets: 371 10*3/uL (ref 140–400)
RBC: 5.34 10*6/uL (ref 4.20–5.80)
RDW: 12.4 % (ref 11.0–15.0)
Total Lymphocyte: 30.2 %
WBC: 7.9 10*3/uL (ref 3.8–10.8)

## 2018-09-09 LAB — COMPREHENSIVE METABOLIC PANEL
AG Ratio: 1.7 (calc) (ref 1.0–2.5)
ALT: 10 U/L (ref 9–46)
AST: 16 U/L (ref 10–35)
Albumin: 4.3 g/dL (ref 3.6–5.1)
Alkaline phosphatase (APISO): 56 U/L (ref 35–144)
BUN: 10 mg/dL (ref 7–25)
CO2: 27 mmol/L (ref 20–32)
Calcium: 9.4 mg/dL (ref 8.6–10.3)
Chloride: 105 mmol/L (ref 98–110)
Creat: 1.05 mg/dL (ref 0.70–1.25)
Globulin: 2.5 g/dL (calc) (ref 1.9–3.7)
Glucose, Bld: 94 mg/dL (ref 65–99)
Potassium: 4.2 mmol/L (ref 3.5–5.3)
Sodium: 141 mmol/L (ref 135–146)
Total Bilirubin: 0.8 mg/dL (ref 0.2–1.2)
Total Protein: 6.8 g/dL (ref 6.1–8.1)

## 2018-09-09 LAB — PSA: PSA: 0.4 ng/mL (ref <=4.0)

## 2018-09-09 LAB — LIPID PANEL
Cholesterol: 184 mg/dL (ref <200)
HDL: 38 mg/dL — ABNORMAL LOW (ref >=40)
LDL Cholesterol (Calc): 127 mg/dL (calc) — ABNORMAL HIGH
Non-HDL Cholesterol (Calc): 146 mg/dL (calc) — ABNORMAL HIGH (ref <130)
Total CHOL/HDL Ratio: 4.8 (calc) (ref <5.0)
Triglycerides: 93 mg/dL (ref <150)

## 2018-09-09 LAB — URINE CULTURE
MICRO NUMBER:: 753812
Result:: NO GROWTH
SPECIMEN QUALITY:: ADEQUATE

## 2018-10-09 ENCOUNTER — Ambulatory Visit: Payer: Managed Care, Other (non HMO) | Admitting: Family Medicine

## 2018-10-28 ENCOUNTER — Telehealth: Payer: Self-pay | Admitting: *Deleted

## 2018-10-28 DIAGNOSIS — R109 Unspecified abdominal pain: Secondary | ICD-10-CM

## 2018-10-28 MED ORDER — HYDROCODONE-ACETAMINOPHEN 5-325 MG PO TABS: 1.0000 | ORAL_TABLET | Freq: Four times a day (QID) | ORAL | 0 refills | Status: DC | PRN

## 2018-10-28 MED ORDER — CIPROFLOXACIN HCL 500 MG PO TABS: 500.0000 mg | ORAL_TABLET | Freq: Two times a day (BID) | ORAL | 0 refills | Status: AC

## 2018-10-28 MED ORDER — TAMSULOSIN HCL 0.4 MG PO CAPS: 0.4000 mg | ORAL_CAPSULE | Freq: Every day | ORAL | 3 refills | Status: DC

## 2018-10-28 NOTE — Telephone Encounter (Signed)
Agree with below, meds sent to pharmacy

## 2018-10-28 NOTE — Telephone Encounter (Signed)
Received call from patient wife.   States that patient is voicing c/o pain in lower abd and genital.   Call placed to patient to inquire. Reports that he is having pain in tract and thinks he has kidney stone.   MD made aware and NO given for Flomax 0.4mg  PO QD, Cipro 500mg  PO BID x7 days, and KUB.   Call placed to patient and patient made aware.   Prescription sent to pharmacy. X-ray ordered.   Please send Hydrocodone for pain.

## 2018-10-30 ENCOUNTER — Other Ambulatory Visit: Payer: Self-pay

## 2018-10-30 ENCOUNTER — Ambulatory Visit
Admission: RE | Admit: 2018-10-30 | Discharge: 2018-10-30 | Disposition: A | Discharge status: Home / Self Care | Payer: Managed Care, Other (non HMO) | Source: Ambulatory Visit | Attending: Family Medicine | Admitting: Family Medicine

## 2018-10-30 DIAGNOSIS — R109 Unspecified abdominal pain: Secondary | ICD-10-CM

## 2019-12-10 ENCOUNTER — Encounter: Payer: Managed Care, Other (non HMO) | Admitting: Family Medicine

## 2020-01-31 ENCOUNTER — Emergency Department (HOSPITAL_COMMUNITY)
Admission: EM | Admit: 2020-01-31 | Discharge: 2020-01-31 | Disposition: A | Discharge status: Home / Self Care | Payer: Managed Care, Other (non HMO) | Attending: Emergency Medicine | Admitting: Emergency Medicine

## 2020-01-31 ENCOUNTER — Other Ambulatory Visit: Payer: Self-pay | Admitting: Family Medicine

## 2020-01-31 ENCOUNTER — Encounter (HOSPITAL_COMMUNITY): Payer: Self-pay

## 2020-01-31 ENCOUNTER — Telehealth: Payer: Self-pay | Admitting: *Deleted

## 2020-01-31 ENCOUNTER — Emergency Department (HOSPITAL_COMMUNITY): Payer: Managed Care, Other (non HMO)

## 2020-01-31 ENCOUNTER — Other Ambulatory Visit: Payer: Self-pay

## 2020-01-31 DIAGNOSIS — Z20822 Contact with and (suspected) exposure to covid-19: Secondary | ICD-10-CM | POA: Insufficient documentation

## 2020-01-31 DIAGNOSIS — I10 Essential (primary) hypertension: Secondary | ICD-10-CM | POA: Insufficient documentation

## 2020-01-31 DIAGNOSIS — F17210 Nicotine dependence, cigarettes, uncomplicated: Secondary | ICD-10-CM | POA: Insufficient documentation

## 2020-01-31 DIAGNOSIS — Z79899 Other long term (current) drug therapy: Secondary | ICD-10-CM | POA: Insufficient documentation

## 2020-01-31 DIAGNOSIS — R059 Cough, unspecified: Secondary | ICD-10-CM | POA: Insufficient documentation

## 2020-01-31 HISTORY — DX: Essential (primary) hypertension: I10

## 2020-01-31 LAB — CBC WITH DIFFERENTIAL/PLATELET
Abs Immature Granulocytes: 0.04 10*3/uL (ref 0.00–0.07)
Basophils Absolute: 0.1 10*3/uL (ref 0.0–0.1)
Basophils Relative: 1 %
Eosinophils Absolute: 0.2 10*3/uL (ref 0.0–0.5)
Eosinophils Relative: 2 %
HCT: 49.4 % (ref 39.0–52.0)
Hemoglobin: 16.1 g/dL (ref 13.0–17.0)
Immature Granulocytes: 0 %
Lymphocytes Relative: 13 %
Lymphs Abs: 1.7 10*3/uL (ref 0.7–4.0)
MCH: 30.4 pg (ref 26.0–34.0)
MCHC: 32.6 g/dL (ref 30.0–36.0)
MCV: 93.2 fL (ref 80.0–100.0)
Monocytes Absolute: 1.4 10*3/uL — ABNORMAL HIGH (ref 0.1–1.0)
Monocytes Relative: 11 %
Neutro Abs: 9.5 10*3/uL — ABNORMAL HIGH (ref 1.7–7.7)
Neutrophils Relative %: 73 %
Platelets: 345 10*3/uL (ref 150–400)
RBC: 5.3 MIL/uL (ref 4.22–5.81)
RDW: 12 % (ref 11.5–15.5)
WBC: 12.9 10*3/uL — ABNORMAL HIGH (ref 4.0–10.5)
nRBC: 0 % (ref 0.0–0.2)

## 2020-01-31 LAB — COMPREHENSIVE METABOLIC PANEL
ALT: 15 U/L (ref 0–44)
AST: 18 U/L (ref 15–41)
Albumin: 4.1 g/dL (ref 3.5–5.0)
Alkaline Phosphatase: 75 U/L (ref 38–126)
Anion gap: 9 (ref 5–15)
BUN: 10 mg/dL (ref 8–23)
CO2: 29 mmol/L (ref 22–32)
Calcium: 8.7 mg/dL — ABNORMAL LOW (ref 8.9–10.3)
Chloride: 101 mmol/L (ref 98–111)
Creatinine, Ser: 0.95 mg/dL (ref 0.61–1.24)
GFR, Estimated: >60 mL/min (ref >60)
Glucose, Bld: 90 mg/dL (ref 70–99)
Potassium: 3.8 mmol/L (ref 3.5–5.1)
Sodium: 139 mmol/L (ref 135–145)
Total Bilirubin: 0.7 mg/dL (ref 0.3–1.2)
Total Protein: 7.3 g/dL (ref 6.5–8.1)

## 2020-01-31 LAB — RESP PANEL BY RT-PCR (FLU A&B, COVID) ARPGX2
Influenza A by PCR: NEGATIVE
Influenza B by PCR: NEGATIVE
SARS Coronavirus 2 by RT PCR: NEGATIVE

## 2020-01-31 MED ORDER — HYDROCOD POLST-CPM POLST ER 10-8 MG/5ML PO SUER: 5.0000 mL | Freq: Two times a day (BID) | ORAL | 0 refills | Status: DC | PRN

## 2020-01-31 MED ORDER — DOXYCYCLINE HYCLATE 100 MG PO CAPS
100.0000 mg | ORAL_CAPSULE | Freq: Two times a day (BID) | ORAL | 0 refills | Status: DC
Start: 2020-01-31 — End: 2020-03-03

## 2020-01-31 NOTE — ED Triage Notes (Signed)
Pt presents to ED with complaints of productive cough, head and chest congestion and headache x 1 month, denies fever.

## 2020-01-31 NOTE — Discharge Instructions (Addendum)
Follow up in 1-2 weeks if not improving. °

## 2020-01-31 NOTE — Telephone Encounter (Signed)
Sent in tussionex  

## 2020-01-31 NOTE — ED Provider Notes (Signed)
Donald Hale   CSN: [id number] Arrival date & time: 01/31/20  4967     History Chief Complaint  Patient presents with  . Cough    Donald Hale is a 65 y.o. male.  Patient complains of a cough number weeks no fever no chills no shortness of breath  The history is provided by the patient and medical records. No language interpreter was used.  Cough Cough characteristics:  Productive Sputum characteristics:  Nondescript Severity:  Moderate Onset quality:  Sudden Timing:  Constant Progression:  Worsening Chronicity:  New Smoker: no   Context: not animal exposure   Relieved by:  Nothing Associated symptoms: no chest pain, no eye discharge, no headaches and no rash        Past Medical History:  Diagnosis Date  . Hypertension   . Kidney stones     Patient Active Problem List   Diagnosis Date Noted  . Constipation 09/07/2018  . Erectile dysfunction 11/02/2013  . Sinusitis, chronic 09/27/2013  . Essential hypertension, benign 09/27/2013  . Routine general medical examination at a health care facility 08/27/2013  . Tobacco use disorder 08/27/2013    Past Surgical History:  Procedure Laterality Date  . tonsillectomy  age 20       Family History  Problem Relation Age of Onset  . Heart disease Mother     Social History   Tobacco Use  . Smoking status: Current Every Day Smoker    Packs/day: 2.00    Types: Cigarettes  . Smokeless tobacco: Never Used  Substance Use Topics  . Alcohol use: Not Currently  . Drug use: No    Home Medications Prior to Admission medications   Medication Sig Start Date End Date Taking? Authorizing Provider  doxycycline (VIBRAMYCIN) 100 MG capsule Take 1 capsule (100 mg total) by mouth 2 (two) times daily. One po bid x 7 days 01/31/20  Yes Milton Ferguson, MD  albuterol [facility] HFA) 108 (90 Base) MCG/ACT inhaler Inhale 2 puffs into the lungs every 4 (four) hours as needed for wheezing or shortness of  breath. 09/07/18   Alycia Rossetti, MD  amLODipine (NORVASC) 10 MG tablet Take 1 tablet (10 mg total) by mouth daily. 09/07/18   [place], Modena Nunnery, MD  fluticasone [facility]) 50 MCG/ACT nasal spray Place 2 sprays into both nostrils daily. 09/07/18   [place], Modena Nunnery, MD  HYDROcodone-acetaminophen (NORCO) 5-325 MG tablet Take 1 tablet by mouth every 6 (six) hours as needed for moderate pain. 10/28/18   Alycia Rossetti, MD  linaclotide [facility]) 72 MCG capsule Take 1 capsule (72 mcg total) by mouth daily before breakfast. 09/07/18   Alycia Rossetti, MD  Multiple Vitamin (MULTIVITAMIN WITH MINERALS) TABS tablet Take 1 tablet by mouth daily.    [provider]  tamsulosin (FLOMAX) 0.4 MG CAPS capsule Take 1 capsule (0.4 mg total) by mouth daily. 10/28/18   Alycia Rossetti, MD    Allergies    Zyban [bupropion]  Review of Systems   Review of Systems  Constitutional: Negative for appetite change and fatigue.  HENT: Negative for congestion, ear discharge and sinus pressure.   Eyes: Negative for discharge.  Respiratory: Positive for cough.   Cardiovascular: Negative for chest pain.  Gastrointestinal: Negative for abdominal pain and diarrhea.  Genitourinary: Negative for frequency and hematuria.  Musculoskeletal: Negative for back pain.  Skin: Negative for rash.  Neurological: Negative for seizures and headaches.  Psychiatric/Behavioral: Negative for hallucinations.    Physical  Exam Updated Vital Signs BP (!) 147/96   Pulse 65   Temp 98.4 F (36.9 C) (Oral)   Resp 18   Ht 5\' 9"  (1.753 m)   Wt 74.8 kg   SpO2 96%   BMI 24.37 kg/m   Physical Exam Vitals and nursing Hale reviewed.  Constitutional:      Appearance: He is well-developed.  HENT:     Head: Normocephalic.     Nose: Nose normal.  Eyes:     General: No scleral icterus.    Extraocular Movements: EOM normal.     Conjunctiva/sclera: Conjunctivae normal.  Neck:     Thyroid: No thyromegaly.  Cardiovascular:      Rate and Rhythm: Normal rate and regular rhythm.     Heart sounds: No murmur heard. No friction rub. No gallop.   Pulmonary:     Breath sounds: No stridor. No wheezing or rales.  Chest:     Chest wall: No tenderness.  Abdominal:     General: There is no distension.     Tenderness: There is no abdominal tenderness. There is no rebound.  Musculoskeletal:        General: No edema. Normal range of motion.     Cervical back: Neck supple.  Lymphadenopathy:     Cervical: No cervical adenopathy.  Skin:    Findings: No erythema or rash.  Neurological:     Mental Status: He is alert and oriented to person, place, and time.     Motor: No abnormal muscle tone.     Coordination: Coordination normal.  Psychiatric:        Mood and Affect: Mood and affect normal.        Behavior: Behavior normal.     ED Results / Procedures / Treatments   Labs (all labs ordered are listed, but only abnormal results are displayed) Labs Reviewed  CBC WITH DIFFERENTIAL/PLATELET - Abnormal; Notable for the following components:      Result Value   WBC 12.9 (*)    Neutro Abs 9.5 (*)    Monocytes Absolute 1.4 (*)    All other components within normal limits  COMPREHENSIVE METABOLIC PANEL - Abnormal; Notable for the following components:   Calcium 8.7 (*)    All other components within normal limits  RESP PANEL BY RT-PCR (FLU A&B, COVID) [id number]    EKG None  Radiology DG Chest Port 1 View  Result Date: 01/31/2020 CLINICAL DATA:  Shortness of breath EXAM: PORTABLE CHEST 1 VIEW COMPARISON:  09/21/2013. FINDINGS: No focal consolidation. No pneumothorax or pleural effusion. Cardiomediastinal silhouette is within normal limits. No acute osseous abnormality. IMPRESSION: No focal airspace disease. Electronically Signed   By: 09/23/2013 M.D.   On: 01/31/2020 08:38    Procedures Procedures (including critical care time)  Medications Ordered in ED Medications - No data to display  ED Course  I have  reviewed the triage vital signs and the nursing notes.  Pertinent labs & imaging results that were available during my care of the patient were reviewed by me and considered in my medical decision making (see chart for details).    MDM Rules/Calculators/A&P                         CBC and chemistries are unremarkable except for mild elevated white count.  Covid negative.  Chest x-ray negative.  Patient will be placed on doxycycline for persistent cough and elevated white count and will follow up  with the PCP Final Clinical Impression(s) / ED Diagnoses Final diagnoses:  Cough    Rx / DC Orders ED Discharge Orders         Ordered    doxycycline (VIBRAMYCIN) 100 MG capsule  2 times daily        01/31/20 1103           Bethann Berkshire, MD 01/31/20 1107

## 2020-01-31 NOTE — Telephone Encounter (Signed)
Received call from patient wife, Burna Mortimer.   Reports that patient was seen at ER for cough and given ABTx.   Requested medication for cough. Please advise.

## 2020-02-11 ENCOUNTER — Encounter: Payer: Managed Care, Other (non HMO) | Admitting: Family Medicine

## 2020-02-17 ENCOUNTER — Other Ambulatory Visit: Payer: Self-pay | Admitting: Family Medicine

## 2020-02-17 DIAGNOSIS — I10 Essential (primary) hypertension: Secondary | ICD-10-CM

## 2020-03-03 ENCOUNTER — Encounter: Payer: Self-pay | Admitting: Nurse Practitioner

## 2020-03-03 ENCOUNTER — Other Ambulatory Visit: Payer: Self-pay

## 2020-03-03 ENCOUNTER — Ambulatory Visit (INDEPENDENT_AMBULATORY_CARE_PROVIDER_SITE_OTHER): Payer: Managed Care, Other (non HMO) | Admitting: Nurse Practitioner

## 2020-03-03 DIAGNOSIS — Z0000 Encounter for general adult medical examination without abnormal findings: Secondary | ICD-10-CM | POA: Insufficient documentation

## 2020-03-03 DIAGNOSIS — F17200 Nicotine dependence, unspecified, uncomplicated: Secondary | ICD-10-CM | POA: Diagnosis not present

## 2020-03-03 DIAGNOSIS — J449 Chronic obstructive pulmonary disease, unspecified: Secondary | ICD-10-CM | POA: Insufficient documentation

## 2020-03-03 DIAGNOSIS — I10 Essential (primary) hypertension: Secondary | ICD-10-CM | POA: Diagnosis not present

## 2020-03-03 DIAGNOSIS — R059 Cough, unspecified: Secondary | ICD-10-CM

## 2020-03-03 DIAGNOSIS — Z87442 Personal history of urinary calculi: Secondary | ICD-10-CM

## 2020-03-03 DIAGNOSIS — N529 Male erectile dysfunction, unspecified: Secondary | ICD-10-CM

## 2020-03-03 DIAGNOSIS — Z7689 Persons encountering health services in other specified circumstances: Secondary | ICD-10-CM

## 2020-03-03 MED ORDER — ALBUTEROL SULFATE HFA 108 (90 BASE) MCG/ACT IN AERS
2.0000 | INHALATION_SPRAY | RESPIRATORY_TRACT | 1 refills | Status: DC | PRN
Start: 2020-03-03 — End: 2020-05-01

## 2020-03-03 MED ORDER — AMLODIPINE BESYLATE 10 MG PO TABS: 10.0000 mg | ORAL_TABLET | Freq: Every day | ORAL | 1 refills | Status: DC

## 2020-03-03 NOTE — Assessment & Plan Note (Signed)
-  no issues today -was treated with flomax previously

## 2020-03-03 NOTE — Assessment & Plan Note (Signed)
-  have records from Dr. Jeanice Lim in Epic -no specialists consulted

## 2020-03-03 NOTE — Assessment & Plan Note (Addendum)
-  BP slightly elevated today at 144/83 -taking amlodipine 10 mg daily

## 2020-03-03 NOTE — Assessment & Plan Note (Signed)
-  Decreased his smoking form 2 packs per day down to about 3/4 of a pack per day -tried Zyban in the past, but had side effects

## 2020-03-03 NOTE — Assessment & Plan Note (Signed)
-  no current medication -tried viagra sample in the past

## 2020-03-03 NOTE — Assessment & Plan Note (Addendum)
-  had illness and was treated with doxycycline, flonase, and tussionex cough syrup -had imaging at ED on 01/31/20 that did not show COPD-related changes despite high pack-yr smoking history -Rx. Albuterol today for his chest tightness -will consider adding prednisone if this does not improve with albuterol

## 2020-03-03 NOTE — Progress Notes (Signed)
New Patient Office Visit  Subjective:  Patient ID: Donald Hale, male    DOB: 10-20-1955  Age: 65 y.o. MRN: [id number]  CC:  Chief Complaint  Patient presents with  . New Patient (Initial Visit)    HPI Donald Hale presents for new patient visit. Transferring care from [facility]. Does not see any specialists. Last labs 01/31/20. Last physical 10/28/2016  He states that he has some chest tightness and some epigastric pain.  He is still coughing up mucus, and he gets dizzy when he has coughing spells.  He states this is getting better, but he is still having coughing spells.  He states he has wheezing at times.   Past Medical History:  Diagnosis Date  . Hypertension   . Kidney stones   . Sinusitis, chronic 09/27/2013    Past Surgical History:  Procedure Laterality Date  . tonsillectomy  age 32    Family History  Problem Relation Age of Onset  . Heart disease Mother     Social History   Socioeconomic History  . Marital status: Married    Spouse name: Not on file  . Number of children: Not on file  . Years of education: Not on file  . Highest education level: Not on file  Occupational History  . Occupation: Dealer    Comment: Tax inspector  Tobacco Use  . Smoking status: Current Every Day Smoker    Packs/day: 1.00    Years: 48.00    Pack years: 48.00    Types: Cigarettes  . Smokeless tobacco: Former Systems developer    Types: [place] date: 01/29/2004  Vaping Use  . Vaping Use: Never used  Substance and Sexual Activity  . Alcohol use: Not Currently  . Drug use: No  . Sexual activity: Yes  Other Topics Concern  . Not on file  Social History Narrative  . Not on file   Social Determinants of Health   Financial Resource Strain: Not on file  Food Insecurity: Not on file  Transportation Needs: Not on file  Physical Activity: Not on file  Stress: Not on file  Social Connections: Not on file  Intimate Partner Violence: Not on file     ROS Review of Systems  Objective:   Today's Vitals: BP (!) 144/83   Pulse 80   Temp 98.4 F (36.9 C)   Resp 16   Ht 5' 10"  (1.778 m)   Wt 174 lb (78.9 kg)   SpO2 96%   BMI 24.97 kg/m   Physical Exam  Assessment & Plan:   Problem List Items Addressed This Visit      Cardiovascular and Mediastinum   Essential hypertension, benign    -BP slightly elevated today at 144/83 -taking amlodipine 10 mg daily      Relevant Medications   amLODipine (NORVASC) 10 MG tablet (Start on 05/01/2020)   Other Relevant Orders   CBC with Differential/Platelet   CMP14+EGFR   Lipid Panel With LDL/HDL Ratio     Other   Tobacco use disorder    -Decreased his smoking form 2 packs per day down to about 3/4 of a pack per day -tried Zyban in the past, but had side effects      Erectile dysfunction    -no current medication -tried viagra sample in the past      Cough    -had illness and was treated with doxycycline, flonase, and tussionex cough syrup -had imaging at ED on  01/31/20 that did not show COPD-related changes despite high pack-yr smoking history -Rx. Albuterol today for his chest tightness -will consider adding prednisone if this does not improve with albuterol      Relevant Medications   albuterol (PROAIR HFA) 108 (90 Base) MCG/ACT inhaler   History of kidney stones    -no issues today -was treated with flomax previously      Encounter to establish care    -have records from Dr. Buelah Manis in [place] -no specialists consulted      Relevant Orders   CBC with Differential/Platelet   CMP14+EGFR   Lipid Panel With LDL/HDL Ratio      Outpatient Encounter Medications as of 03/03/2020  Medication Sig  . Multiple Vitamin (MULTIVITAMIN WITH MINERALS) TABS tablet Take 1 tablet by mouth daily.  . [DISCONTINUED] amLODipine (NORVASC) 10 MG tablet Take 1 tablet by mouth once daily  . albuterol (PROAIR HFA) 108 (90 Base) MCG/ACT inhaler Inhale 2 puffs into the lungs every 4 (four)  hours as needed for wheezing or shortness of breath.  Derrill Memo ON 05/01/2020] amLODipine (NORVASC) 10 MG tablet Take 1 tablet (10 mg total) by mouth daily.  . [DISCONTINUED] albuterol (PROAIR HFA) 108 (90 Base) MCG/ACT inhaler Inhale 2 puffs into the lungs every 4 (four) hours as needed for wheezing or shortness of breath. (Patient not taking: Reported on 03/03/2020)  . [DISCONTINUED] chlorpheniramine-HYDROcodone (TUSSIONEX PENNKINETIC ER) 10-8 MG/5ML SUER Take 5 mLs by mouth every 12 (twelve) hours as needed for cough. (Patient not taking: Reported on 03/03/2020)  . [DISCONTINUED] doxycycline (VIBRAMYCIN) 100 MG capsule Take 1 capsule (100 mg total) by mouth 2 (two) times daily. One po bid x 7 days (Patient not taking: Reported on 03/03/2020)  . [DISCONTINUED] fluticasone (FLONASE) 50 MCG/ACT nasal spray Place 2 sprays into both nostrils daily. (Patient not taking: Reported on 03/03/2020)  . [DISCONTINUED] HYDROcodone-acetaminophen (NORCO) 5-325 MG tablet Take 1 tablet by mouth every 6 (six) hours as needed for moderate pain. (Patient not taking: Reported on 03/03/2020)  . [DISCONTINUED] linaclotide (LINZESS) 72 MCG capsule Take 1 capsule (72 mcg total) by mouth daily before breakfast. (Patient not taking: Reported on 03/03/2020)  . [DISCONTINUED] tamsulosin (FLOMAX) 0.4 MG CAPS capsule Take 1 capsule (0.4 mg total) by mouth daily. (Patient not taking: Reported on 03/03/2020)   No facility-administered encounter medications on file as of 03/03/2020.    Follow-up: Return in about 2 weeks (around 03/17/2020) for Routine physical exam.   Noreene Larsson, NP

## 2020-03-03 NOTE — Patient Instructions (Signed)
It was nice meeting you today.  I sent in albuterol to help with your chest tightness.  We will meet back up in 2 weeks for a physical. Please have fasting labs drawn 2-3 days prior to your visit so we can discuss them at your appointment.

## 2020-03-15 LAB — CMP14+EGFR
ALT: 14 IU/L (ref 0–44)
AST: 15 IU/L (ref 0–40)
Albumin/Globulin Ratio: 1.9 (ref 1.2–2.2)
Albumin: 4.6 g/dL (ref 3.8–4.8)
Alkaline Phosphatase: 88 IU/L (ref 44–121)
BUN/Creatinine Ratio: 10 (ref 10–24)
BUN: 10 mg/dL (ref 8–27)
Bilirubin Total: 0.6 mg/dL (ref 0.0–1.2)
CO2: 23 mmol/L (ref 20–29)
Calcium: 9.4 mg/dL (ref 8.6–10.2)
Chloride: 101 mmol/L (ref 96–106)
Creatinine, Ser: 1.04 mg/dL (ref 0.76–1.27)
GFR calc Af Amer: 88 mL/min/{1.73_m2} (ref >59)
GFR calc non Af Amer: 76 mL/min/{1.73_m2} (ref >59)
Globulin, Total: 2.4 g/dL (ref 1.5–4.5)
Glucose: 96 mg/dL (ref 65–99)
Potassium: 4 mmol/L (ref 3.5–5.2)
Sodium: 139 mmol/L (ref 134–144)
Total Protein: 7 g/dL (ref 6.0–8.5)

## 2020-03-15 LAB — CBC WITH DIFFERENTIAL/PLATELET
Basophils Absolute: 0.1 10*3/uL (ref 0.0–0.2)
Basos: 1 %
EOS (ABSOLUTE): 0.2 10*3/uL (ref 0.0–0.4)
Eos: 2 %
Hematocrit: 46.4 % (ref 37.5–51.0)
Hemoglobin: 15.6 g/dL (ref 13.0–17.7)
Immature Grans (Abs): 0 10*3/uL (ref 0.0–0.1)
Immature Granulocytes: 0 %
Lymphocytes Absolute: 1.9 10*3/uL (ref 0.7–3.1)
Lymphs: 21 %
MCH: 29.7 pg (ref 26.6–33.0)
MCHC: 33.6 g/dL (ref 31.5–35.7)
MCV: 88 fL (ref 79–97)
Monocytes Absolute: 0.9 10*3/uL (ref 0.1–0.9)
Monocytes: 10 %
Neutrophils Absolute: 5.9 10*3/uL (ref 1.4–7.0)
Neutrophils: 66 %
Platelets: 342 10*3/uL (ref 150–450)
RBC: 5.25 x10E6/uL (ref 4.14–5.80)
RDW: 12.1 % (ref 11.6–15.4)
WBC: 9 10*3/uL (ref 3.4–10.8)

## 2020-03-15 LAB — LIPID PANEL WITH LDL/HDL RATIO
Cholesterol, Total: 173 mg/dL (ref 100–199)
HDL: 38 mg/dL — ABNORMAL LOW (ref >39)
LDL Chol Calc (NIH): 117 mg/dL — ABNORMAL HIGH (ref 0–99)
LDL/HDL Ratio: 3.1 ratio (ref 0.0–3.6)
Triglycerides: 96 mg/dL (ref 0–149)
VLDL Cholesterol Cal: 18 mg/dL (ref 5–40)

## 2020-03-15 NOTE — Progress Notes (Signed)
LDL, or bad cholesterol, is elevated at 117, and we want that lower than 100. No need to add meds today, but we can discuss this at the appointment on 03/20/20.

## 2020-03-20 ENCOUNTER — Encounter: Payer: Self-pay | Admitting: Nurse Practitioner

## 2020-03-20 ENCOUNTER — Other Ambulatory Visit: Payer: Self-pay

## 2020-03-20 ENCOUNTER — Ambulatory Visit: Payer: Managed Care, Other (non HMO) | Admitting: Nurse Practitioner

## 2020-03-20 DIAGNOSIS — R059 Cough, unspecified: Secondary | ICD-10-CM

## 2020-03-20 DIAGNOSIS — I10 Essential (primary) hypertension: Secondary | ICD-10-CM | POA: Diagnosis not present

## 2020-03-20 DIAGNOSIS — Z0000 Encounter for general adult medical examination without abnormal findings: Secondary | ICD-10-CM

## 2020-03-20 DIAGNOSIS — F17200 Nicotine dependence, unspecified, uncomplicated: Secondary | ICD-10-CM | POA: Diagnosis not present

## 2020-03-20 MED ORDER — ATORVASTATIN CALCIUM 20 MG PO TABS: 20.0000 mg | ORAL_TABLET | Freq: Every day | ORAL | 3 refills | Status: DC

## 2020-03-20 MED ORDER — ANORO ELLIPTA 62.5-25 MCG/INH IN AEPB
1.0000 | INHALATION_SPRAY | Freq: Every day | RESPIRATORY_TRACT | 2 refills | Status: DC
Start: 2020-03-20 — End: 2020-06-19

## 2020-03-20 NOTE — Assessment & Plan Note (Signed)
-  previously, had illness and was treated with doxycycline, flonase, and tussionex cough syrup -had imaging at ED on 01/31/20 that did not show COPD-related changes despite high pack-yr smoking history -Albuterol has been helping with his chest tightness -has diminished right lower lobe sounds -Rx. Anoro; he is using albuterol 1-2x per day, so he may feel better longer with this

## 2020-03-20 NOTE — Assessment & Plan Note (Signed)
-  Decreased his smoking from 2 packs per day down to about 3/4 of a pack per day -tried Zyban in the past, but had side effects

## 2020-03-20 NOTE — Progress Notes (Signed)
Established Patient Office Visit  Subjective:  Patient ID: Donald Hale, male    DOB: 1955/11/27  Age: 65 y.o. MRN: [id number]  CC:  Chief Complaint  Patient presents with  . Annual Exam    HPI Donald Hale presents for physical exam. At his last appt., his BP was slightly elevated despite him taking daily amlodipine.   He states that he is still coughing up phlegm, but it is much better than previously.  He has been using his albuterol inhaler 1-2 times per day, and he states he is breathing better.  Past Medical History:  Diagnosis Date  . Hypertension   . Kidney stones   . Sinusitis, chronic 09/27/2013    Past Surgical History:  Procedure Laterality Date  . tonsillectomy  age 52    Family History  Problem Relation Age of Onset  . Heart disease Mother     Social History   Socioeconomic History  . Marital status: Married    Spouse name: Not on file  . Number of children: Not on file  . Years of education: Not on file  . Highest education level: Not on file  Occupational History  . Occupation: Curator    Comment: Patent examiner  Tobacco Use  . Smoking status: Current Every Day Smoker    Packs/day: 1.00    Years: 48.00    Pack years: 48.00    Types: Cigarettes  . Smokeless tobacco: Former Neurosurgeon    Types: Chew    Quit date: 01/29/2004  Vaping Use  . Vaping Use: Never used  Substance and Sexual Activity  . Alcohol use: Not Currently  . Drug use: No  . Sexual activity: Yes  Other Topics Concern  . Not on file  Social History Narrative  . Not on file   Social Determinants of Health   Financial Resource Strain: Not on file  Food Insecurity: Not on file  Transportation Needs: Not on file  Physical Activity: Not on file  Stress: Not on file  Social Connections: Not on file  Intimate Partner Violence: Not on file    Outpatient Medications Prior to Visit  Medication Sig Dispense Refill  . albuterol (PROAIR HFA) 108 (90 Base) MCG/ACT inhaler  Inhale 2 puffs into the lungs every 4 (four) hours as needed for wheezing or shortness of breath. 18 g 1  . [START ON 05/01/2020] amLODipine (NORVASC) 10 MG tablet Take 1 tablet (10 mg total) by mouth daily. 90 tablet 1  . Multiple Vitamin (MULTIVITAMIN WITH MINERALS) TABS tablet Take 1 tablet by mouth daily.     No facility-administered medications prior to visit.    Allergies  Allergen Reactions  . Zyban [Bupropion]     Headache, CP    ROS Review of Systems  Constitutional: Negative.   HENT: Negative.   Eyes: Negative.   Respiratory: Positive for cough. Negative for chest tightness, shortness of breath and wheezing.   Cardiovascular: Negative.   Gastrointestinal: Negative.   Endocrine: Negative.   Genitourinary: Negative.   Musculoskeletal: Negative.   Skin: Negative.   Allergic/Immunologic: Negative.   Neurological: Negative.   Hematological: Negative.   Psychiatric/Behavioral: Negative.       Objective:    Physical Exam Constitutional:      Appearance: Normal appearance.  HENT:     Head: Normocephalic and atraumatic.     Right Ear: Tympanic membrane, ear canal and external ear normal.     Left Ear: Tympanic membrane, ear canal and external ear normal.  Nose: Nose normal.     Mouth/Throat:     Mouth: Mucous membranes are moist.     Pharynx: Oropharynx is clear.  Eyes:     Extraocular Movements: Extraocular movements intact.     Conjunctiva/sclera: Conjunctivae normal.     Pupils: Pupils are equal, round, and reactive to light.  Cardiovascular:     Rate and Rhythm: Normal rate and regular rhythm.     Pulses: Normal pulses.     Heart sounds: Normal heart sounds.  Pulmonary:     Effort: Pulmonary effort is normal.     Comments: Diminished right lower lung sounds Abdominal:     General: Abdomen is flat. Bowel sounds are normal.     Palpations: Abdomen is soft.  Musculoskeletal:        General: Normal range of motion.     Cervical back: Normal range of  motion and neck supple.  Skin:    General: Skin is warm and dry.     Capillary Refill: Capillary refill takes less than 2 seconds.  Neurological:     General: No focal deficit present.     Mental Status: He is alert and oriented to person, place, and time.  Psychiatric:        Mood and Affect: Mood normal.        Behavior: Behavior normal.        Thought Content: Thought content normal.        Judgment: Judgment normal.     BP 136/76   Pulse 80   Temp 98.3 F (36.8 C)   Resp 20   Ht 5\' 8"  (1.727 m)   Wt 176 lb (79.8 kg)   SpO2 93%   BMI 26.76 kg/m  Wt Readings from Last 3 Encounters:  03/20/20 176 lb (79.8 kg)  03/03/20 174 lb (78.9 kg)  01/31/20 165 lb (74.8 kg)     There are no preventive care reminders to display for this patient.  There are no preventive care reminders to display for this patient.  Lab Results  Component Value Date   TSH 1.870 09/08/2014   Lab Results  Component Value Date   WBC 9.0 03/14/2020   HGB 15.6 03/14/2020   HCT 46.4 03/14/2020   MCV 88 03/14/2020   PLT 342 03/14/2020   Lab Results  Component Value Date   NA 139 03/14/2020   K 4.0 03/14/2020   CO2 23 03/14/2020   GLUCOSE 96 03/14/2020   BUN 10 03/14/2020   CREATININE 1.04 03/14/2020   BILITOT 0.6 03/14/2020   ALKPHOS 88 03/14/2020   AST 15 03/14/2020   ALT 14 03/14/2020   PROT 7.0 03/14/2020   ALBUMIN 4.6 03/14/2020   CALCIUM 9.4 03/14/2020   ANIONGAP 9 01/31/2020   Lab Results  Component Value Date   CHOL 173 03/14/2020   Lab Results  Component Value Date   HDL 38 (L) 03/14/2020   Lab Results  Component Value Date   LDLCALC 117 (H) 03/14/2020   Lab Results  Component Value Date   TRIG 96 03/14/2020   Lab Results  Component Value Date   CHOLHDL 4.8 09/08/2018   Lab Results  Component Value Date   HGBA1C 5.3 10/10/2015      Assessment & Plan:   Problem List Items Addressed This Visit      Cardiovascular and Mediastinum   Essential  hypertension, benign    -well controlled today -continue amlodipine      Relevant Medications   atorvastatin (  LIPITOR) 20 MG tablet     Other   Tobacco use disorder    -Decreased his smoking from 2 packs per day down to about 3/4 of a pack per day -tried Zyban in the past, but had side effects      Cough    -previously, had illness and was treated with doxycycline, flonase, and tussionex cough syrup -had imaging at ED on 01/31/20 that did not show COPD-related changes despite high pack-yr smoking history -Albuterol has been helping with his chest tightness -has diminished right lower lobe sounds -Rx. Anoro; he is using albuterol 1-2x per day, so he may feel better longer with this      Preventative health care    -had cologuard 2 years ago, he will be due Spring 2023 -CPE completed         Meds ordered this encounter  Medications  . atorvastatin (LIPITOR) 20 MG tablet    Sig: Take 1 tablet (20 mg total) by mouth daily.    Dispense:  90 tablet    Refill:  3  . umeclidinium-vilanterol (ANORO ELLIPTA) 62.5-25 MCG/INH AEPB    Sig: Inhale 1 puff into the lungs daily.    Dispense:  60 each    Refill:  2    Follow-up: Return in about 3 months (around 06/17/2020) for Lab follow-up (started atorvastatin).    Heather Roberts, NP

## 2020-03-20 NOTE — Assessment & Plan Note (Addendum)
-  well controlled today -continue amlodipine 

## 2020-03-20 NOTE — Assessment & Plan Note (Addendum)
-  had cologuard 2 years ago, he will be due Spring 2023 -CPE completed

## 2020-04-29 ENCOUNTER — Other Ambulatory Visit: Payer: Self-pay | Admitting: Nurse Practitioner

## 2020-04-29 DIAGNOSIS — R059 Cough, unspecified: Secondary | ICD-10-CM

## 2020-06-14 ENCOUNTER — Other Ambulatory Visit: Payer: Self-pay | Admitting: Nurse Practitioner

## 2020-06-14 DIAGNOSIS — I10 Essential (primary) hypertension: Secondary | ICD-10-CM

## 2020-06-19 ENCOUNTER — Encounter: Payer: Self-pay | Admitting: Nurse Practitioner

## 2020-06-19 ENCOUNTER — Ambulatory Visit: Payer: Managed Care, Other (non HMO) | Admitting: Nurse Practitioner

## 2020-06-19 ENCOUNTER — Other Ambulatory Visit: Payer: Self-pay

## 2020-06-19 VITALS — BP 128/72 | HR 82 | Temp 98.4°F | Resp 18 | Ht 68.0 in | Wt 176.0 lb

## 2020-06-19 DIAGNOSIS — J449 Chronic obstructive pulmonary disease, unspecified: Secondary | ICD-10-CM

## 2020-06-19 DIAGNOSIS — E785 Hyperlipidemia, unspecified: Secondary | ICD-10-CM | POA: Diagnosis not present

## 2020-06-19 DIAGNOSIS — I10 Essential (primary) hypertension: Secondary | ICD-10-CM | POA: Diagnosis not present

## 2020-06-19 NOTE — Assessment & Plan Note (Signed)
-  well controlled today 

## 2020-06-19 NOTE — Assessment & Plan Note (Signed)
Lab Results  Component Value Date   CHOL 173 03/14/2020   HDL 38 (L) 03/14/2020   LDLCALC 117 (H) 03/14/2020   TRIG 96 03/14/2020   CHOLHDL 4.8 09/08/2018   -he has been taking atorvastatin as prescribed -fasting labs ordered

## 2020-06-19 NOTE — Patient Instructions (Signed)
Please have fasting labs drawn some time this week so we can recheck your cholesterol since starting atorvastatin.  For your next appointment, please have fasting labs drawn 2-3 days prior to your appointment so we can discuss the results during your office visit.

## 2020-06-19 NOTE — Progress Notes (Signed)
Acute Office Visit  Subjective:    Patient ID: Donald Hale, male    DOB: [birth year], 65 y.o.   MRN: [id number]  Chief Complaint  Patient presents with  . Hypertension    HPI Patient is in today for med check. 3 months ago he was started on anoro and atorvastatin. He states that the Anoro didn't work well for him, and it was too expensive. He states it didn't have the bang for the buck.  He didn't have fasting labs drawn prior to this OV.      Past Medical History:  Diagnosis Date  . Hypertension   . Kidney stones   . Sinusitis, chronic 09/27/2013    Past Surgical History:  Procedure Laterality Date  . tonsillectomy  age 81    Family History  Problem Relation Age of Onset  . Heart disease Mother     Social History   Socioeconomic History  . Marital status: Married    Spouse name: Not on file  . Number of children: Not on file  . Years of education: Not on file  . Highest education level: Not on file  Occupational History  . Occupation: Dealer    Comment: Tax inspector  Tobacco Use  . Smoking status: Current Every Day Smoker    Packs/day: 1.00    Years: 48.00    Pack years: 48.00    Types: Cigarettes  . Smokeless tobacco: Former Systems developer    Types: [place] date: 01/29/2004  Vaping Use  . Vaping Use: Never used  Substance and Sexual Activity  . Alcohol use: Not Currently  . Drug use: No  . Sexual activity: Yes  Other Topics Concern  . Not on file  Social History Narrative  . Not on file   Social Determinants of Health   Financial Resource Strain: Not on file  Food Insecurity: Not on file  Transportation Needs: Not on file  Physical Activity: Not on file  Stress: Not on file  Social Connections: Not on file  Intimate Partner Violence: Not on file    Outpatient Medications Prior to Visit  Medication Sig Dispense Refill  . albuterol (VENTOLIN HFA) 108 (90 Base) MCG/ACT inhaler INHALE 2 PUFFS INTO THE LUNGS EVERY 4 HOURS AS NEEDED  FOR WHEEZING OR SHORTNESS OF BREATH. 18 each 1  . amLODipine (NORVASC) 10 MG tablet TAKE 1 TABLET BY MOUTH EVERY DAY 90 tablet 1  . atorvastatin (LIPITOR) 20 MG tablet Take 1 tablet (20 mg total) by mouth daily. 90 tablet 3  . Multiple Vitamin (MULTIVITAMIN WITH MINERALS) TABS tablet Take 1 tablet by mouth daily.    Marland Kitchen umeclidinium-vilanterol (ANORO ELLIPTA) 62.5-25 MCG/INH AEPB Inhale 1 puff into the lungs daily. 60 each 2   No facility-administered medications prior to visit.    Allergies  Allergen Reactions  . Zyban [Bupropion]     Headache, CP    Review of Systems  Constitutional: Negative.   Respiratory: Negative.        Doing well with albuterol  Cardiovascular: Negative.   Musculoskeletal: Negative.   Neurological: Positive for numbness.       To bilateral hands  Psychiatric/Behavioral: Negative.        Objective:    Physical Exam Constitutional:      Appearance: Normal appearance.  Cardiovascular:     Rate and Rhythm: Normal rate and regular rhythm.     Pulses: Normal pulses.     Heart sounds: Normal heart sounds.  Pulmonary:  Effort: Pulmonary effort is normal.     Breath sounds: Normal breath sounds.  Musculoskeletal:     Comments: Negative Phalen's test  Neurological:     Mental Status: He is alert.  Psychiatric:        Mood and Affect: Mood normal.        Behavior: Behavior normal.        Thought Content: Thought content normal.        Judgment: Judgment normal.     BP 128/72   Pulse 82   Temp 98.4 F (36.9 C)   Resp 18   Ht _0  (1.727 m)   Wt 176 lb (79.8 kg)   SpO2 94%   BMI 26.76 kg/m  Wt Readings from Last 3 Encounters:  06/19/20 176 lb (79.8 kg)  03/20/20 176 lb (79.8 kg)  03/03/20 174 lb (78.9 kg)    Health Maintenance Due  Topic Date Due  . COVID-19 Vaccine (1) Never done    There are no preventive care reminders to display for this patient.   Lab Results  Component Value Date   TSH 1.870 09/08/2014   Lab Results   Component Value Date   WBC 9.0 03/14/2020   HGB 15.6 03/14/2020   HCT 46.4 03/14/2020   MCV 88 03/14/2020   PLT 342 03/14/2020   Lab Results  Component Value Date   NA 139 03/14/2020   K 4.0 03/14/2020   CO2 23 03/14/2020   GLUCOSE 96 03/14/2020   BUN 10 03/14/2020   CREATININE 1.04 03/14/2020   BILITOT 0.6 03/14/2020   ALKPHOS 88 03/14/2020   AST 15 03/14/2020   ALT 14 03/14/2020   PROT 7.0 03/14/2020   ALBUMIN 4.6 03/14/2020   CALCIUM 9.4 03/14/2020   ANIONGAP 9 01/31/2020   Lab Results  Component Value Date   CHOL 173 03/14/2020   Lab Results  Component Value Date   HDL 38 (L) 03/14/2020   Lab Results  Component Value Date   LDLCALC 117 (H) 03/14/2020   Lab Results  Component Value Date   TRIG 96 03/14/2020   Lab Results  Component Value Date   CHOLHDL 4.8 09/08/2018   Lab Results  Component Value Date   HGBA1C 5.3 10/10/2015       Assessment & Plan:   Problem List Items Addressed This Visit      Cardiovascular and Mediastinum   Essential hypertension, benign - Primary    -well controlled today      Relevant Orders   CBC with Differential/Platelet   CMP14+EGFR     Respiratory   COPD (chronic obstructive pulmonary disease) ([place])    -started Anoro at last OV, but he states it is too expensive, and he likes the albuterol better -STOP Anoro d/t cost; may consider symbicort or breztri in the future -continue albuterol; he is using it 1-2 x per day -still smoking        Other   Hyperlipidemia    Lab Results  Component Value Date   CHOL 173 03/14/2020   HDL 38 (L) 03/14/2020   LDLCALC 117 (H) 03/14/2020   TRIG 96 03/14/2020   CHOLHDL 4.8 09/08/2018   -he has been taking atorvastatin as prescribed -fasting labs ordered      Relevant Orders   Lipid Panel With LDL/HDL Ratio       No orders of the defined types were placed in this encounter.    Noreene Larsson, NP

## 2020-06-19 NOTE — Assessment & Plan Note (Signed)
-  started Anoro at last OV, but he states it is too expensive, and he likes the albuterol better -STOP Anoro d/t cost; may consider symbicort or breztri in the future -continue albuterol; he is using it 1-2 x per day -still smoking

## 2020-07-15 ENCOUNTER — Other Ambulatory Visit: Payer: Self-pay | Admitting: Nurse Practitioner

## 2020-07-15 DIAGNOSIS — R059 Cough, unspecified: Secondary | ICD-10-CM

## 2020-10-05 ENCOUNTER — Other Ambulatory Visit: Payer: Self-pay | Admitting: Nurse Practitioner

## 2020-10-05 DIAGNOSIS — R059 Cough, unspecified: Secondary | ICD-10-CM

## 2020-12-15 ENCOUNTER — Other Ambulatory Visit: Payer: Self-pay | Admitting: Nurse Practitioner

## 2020-12-15 DIAGNOSIS — R059 Cough, unspecified: Secondary | ICD-10-CM

## 2020-12-20 ENCOUNTER — Ambulatory Visit: Payer: Managed Care, Other (non HMO) | Admitting: Nurse Practitioner

## 2021-01-08 ENCOUNTER — Ambulatory Visit: Payer: Managed Care, Other (non HMO) | Admitting: Nurse Practitioner

## 2021-01-08 ENCOUNTER — Other Ambulatory Visit: Payer: Self-pay

## 2021-01-08 ENCOUNTER — Encounter: Payer: Self-pay | Admitting: Nurse Practitioner

## 2021-01-08 VITALS — BP 150/83 | HR 77 | Ht 67.0 in | Wt 179.1 lb

## 2021-01-08 DIAGNOSIS — I10 Essential (primary) hypertension: Secondary | ICD-10-CM

## 2021-01-08 DIAGNOSIS — J449 Chronic obstructive pulmonary disease, unspecified: Secondary | ICD-10-CM

## 2021-01-08 DIAGNOSIS — E785 Hyperlipidemia, unspecified: Secondary | ICD-10-CM

## 2021-01-08 MED ORDER — BREZTRI AEROSPHERE 160-9-4.8 MCG/ACT IN AERO: 2.0000 | INHALATION_SPRAY | Freq: Two times a day (BID) | RESPIRATORY_TRACT | 11 refills | Status: DC

## 2021-01-08 NOTE — Assessment & Plan Note (Signed)
-  he frequently uses albuterol inhaler -Rx. breztri for maintenance therapy; Zero pay card provided

## 2021-01-08 NOTE — Assessment & Plan Note (Signed)
-  lipid panel ordered 

## 2021-01-08 NOTE — Progress Notes (Signed)
Acute Office Visit  Subjective:    Patient ID: Donald Hale, male    DOB: 1955-11-07, 65 y.o.   MRN: [id number]  Chief Complaint  Patient presents with   Follow-up    6 month follow up     HPI Patient is in today for lab follow-up for HTN, HLD, and COPD.  He states his albuterol inhaler last 3-4 weeks, but he is not taking any maintenance medication. He smokes prior to coming into the office. No adverse medication effects reported.  Past Medical History:  Diagnosis Date   Hypertension    Kidney stones    Sinusitis, chronic 09/27/2013    Past Surgical History:  Procedure Laterality Date   tonsillectomy  age 3    Family History  Problem Relation Age of Onset   Heart disease Mother     Social History   Socioeconomic History   Marital status: Married    Spouse name: Not on file   Number of children: Not on file   Years of education: Not on file   Highest education level: Not on file  Occupational History   Occupation: Dealer    Comment: Tax inspector  Tobacco Use   Smoking status: Every Day    Packs/day: 1.00    Years: 48.00    Pack years: 48.00    Types: Cigarettes   Smokeless tobacco: Former    Types: Chew    Quit date: 01/29/2004  Vaping Use   Vaping Use: Never used  Substance and Sexual Activity   Alcohol use: Not Currently   Drug use: No   Sexual activity: Yes  Other Topics Concern   Not on file  Social History Narrative   Not on file   Social Determinants of Health   Financial Resource Strain: Not on file  Food Insecurity: Not on file  Transportation Needs: Not on file  Physical Activity: Not on file  Stress: Not on file  Social Connections: Not on file  Intimate Partner Violence: Not on file    Outpatient Medications Prior to Visit  Medication Sig Dispense Refill   albuterol (VENTOLIN HFA) 108 (90 Base) MCG/ACT inhaler INHALE 2 PUFFS BY MOUTH EVERY 4 HOURS AS NEEDED FOR WHEEZE OR FOR SHORTNESS OF BREATH 8.5 each 1   amLODipine  (NORVASC) 10 MG tablet TAKE 1 TABLET BY MOUTH EVERY DAY 90 tablet 1   atorvastatin (LIPITOR) 20 MG tablet Take 1 tablet (20 mg total) by mouth daily. 90 tablet 3   Multiple Vitamin (MULTIVITAMIN WITH MINERALS) TABS tablet Take 1 tablet by mouth daily.     No facility-administered medications prior to visit.    Allergies  Allergen Reactions   Zyban [Bupropion]     Headache, CP    Review of Systems  Constitutional: Negative.   Respiratory:  Positive for cough and wheezing.        At baseline  Cardiovascular: Negative.   Musculoskeletal: Negative.   Psychiatric/Behavioral: Negative.        Objective:    Physical Exam Constitutional:      Appearance: Normal appearance.  Cardiovascular:     Rate and Rhythm: Normal rate and regular rhythm.     Pulses: Normal pulses.     Heart sounds: Normal heart sounds.  Pulmonary:     Effort: Pulmonary effort is normal.     Breath sounds: Normal breath sounds.  Musculoskeletal:        General: Normal range of motion.  Neurological:     Mental  Status: He is alert.  Psychiatric:        Mood and Affect: Mood normal.        Behavior: Behavior normal.        Thought Content: Thought content normal.        Judgment: Judgment normal.    BP (!) 150/83   Pulse 77   Ht _0  (1.702 m)   Wt 179 lb 1.9 oz (81.2 kg)   SpO2 91%   BMI 28.05 kg/m  Wt Readings from Last 3 Encounters:  01/08/21 179 lb 1.9 oz (81.2 kg)  06/19/20 176 lb (79.8 kg)  03/20/20 176 lb (79.8 kg)    Health Maintenance Due  Topic Date Due   Zoster Vaccines- Shingrix (1 of 2) Never done   Pneumococcal Vaccine 53-64 Years old (2 - PCV) 09/14/2015    There are no preventive care reminders to display for this patient.   Lab Results  Component Value Date   TSH 1.870 09/08/2014   Lab Results  Component Value Date   WBC 9.0 03/14/2020   HGB 15.6 03/14/2020   HCT 46.4 03/14/2020   MCV 88 03/14/2020   PLT 342 03/14/2020   Lab Results  Component Value Date   NA  139 03/14/2020   K 4.0 03/14/2020   CO2 23 03/14/2020   GLUCOSE 96 03/14/2020   BUN 10 03/14/2020   CREATININE 1.04 03/14/2020   BILITOT 0.6 03/14/2020   ALKPHOS 88 03/14/2020   AST 15 03/14/2020   ALT 14 03/14/2020   PROT 7.0 03/14/2020   ALBUMIN 4.6 03/14/2020   CALCIUM 9.4 03/14/2020   ANIONGAP 9 01/31/2020   Lab Results  Component Value Date   CHOL 173 03/14/2020   Lab Results  Component Value Date   HDL 38 (L) 03/14/2020   Lab Results  Component Value Date   LDLCALC 117 (H) 03/14/2020   Lab Results  Component Value Date   TRIG 96 03/14/2020   Lab Results  Component Value Date   CHOLHDL 4.8 09/08/2018   Lab Results  Component Value Date   HGBA1C 5.3 10/10/2015       Assessment & Plan:   Problem List Items Addressed This Visit       Cardiovascular and Mediastinum   Essential hypertension, benign - Primary    BP Readings from Last 3 Encounters:  01/08/21 (!) 150/83  06/19/20 128/72  03/20/20 136/76  -he smoked just prior to walking in -states he has good medication compliance      Relevant Orders   CBC with Differential/Platelet   CMP14+EGFR   Lipid Panel With LDL/HDL Ratio     Respiratory   COPD (chronic obstructive pulmonary disease) ([place])    -he frequently uses albuterol inhaler -Rx. breztri for maintenance therapy; Zero pay card provided      Relevant Medications   Budeson-Glycopyrrol-Formoterol (BREZTRI AEROSPHERE) 160-9-4.8 MCG/ACT AERO     Other   Hyperlipidemia    -lipid panel ordered        Meds ordered this encounter  Medications   Budeson-Glycopyrrol-Formoterol (BREZTRI AEROSPHERE) 160-9-4.8 MCG/ACT AERO    Sig: Inhale 2 puffs into the lungs 2 (two) times daily.    Dispense:  10.7 g    Refill:  Jansen, NP

## 2021-01-08 NOTE — Assessment & Plan Note (Signed)
BP Readings from Last 3 Encounters:  01/08/21 (!) 150/83  06/19/20 128/72  03/20/20 136/76   -he smoked just prior to walking in -states he has good medication compliance

## 2021-01-08 NOTE — Patient Instructions (Signed)
Please have fasting labs drawn asa soon as possible.  I will be moving to [facility] located at [address] [place], [place] [address] effective Jan 28, 2021. If you would like to establish care with [facility] please call [phone].

## 2021-01-23 ENCOUNTER — Ambulatory Visit: Payer: Managed Care, Other (non HMO) | Admitting: Nurse Practitioner

## 2021-05-28 ENCOUNTER — Other Ambulatory Visit: Payer: Self-pay | Admitting: Internal Medicine

## 2021-05-28 DIAGNOSIS — I10 Essential (primary) hypertension: Secondary | ICD-10-CM

## 2021-06-28 LAB — LIPID PANEL WITH LDL/HDL RATIO
Cholesterol, Total: 189 mg/dL (ref 100–199)
HDL: 45 mg/dL (ref >39)
LDL Chol Calc (NIH): 129 mg/dL — ABNORMAL HIGH (ref 0–99)
LDL/HDL Ratio: 2.9 ratio (ref 0.0–3.6)
Triglycerides: 81 mg/dL (ref 0–149)
VLDL Cholesterol Cal: 15 mg/dL (ref 5–40)

## 2021-06-28 LAB — CMP14+EGFR
ALT: 12 IU/L (ref 0–44)
AST: 20 IU/L (ref 0–40)
Albumin/Globulin Ratio: 2 (ref 1.2–2.2)
Albumin: 4.9 g/dL — ABNORMAL HIGH (ref 3.8–4.8)
Alkaline Phosphatase: 77 IU/L (ref 44–121)
BUN/Creatinine Ratio: 10 (ref 10–24)
BUN: 11 mg/dL (ref 8–27)
Bilirubin Total: 0.7 mg/dL (ref 0.0–1.2)
CO2: 22 mmol/L (ref 20–29)
Calcium: 9.6 mg/dL (ref 8.6–10.2)
Chloride: 102 mmol/L (ref 96–106)
Creatinine, Ser: 1.07 mg/dL (ref 0.76–1.27)
Globulin, Total: 2.4 g/dL (ref 1.5–4.5)
Glucose: 98 mg/dL (ref 70–99)
Potassium: 4.3 mmol/L (ref 3.5–5.2)
Sodium: 143 mmol/L (ref 134–144)
Total Protein: 7.3 g/dL (ref 6.0–8.5)
eGFR: 77 mL/min/{1.73_m2} (ref >59)

## 2021-06-28 LAB — CBC WITH DIFFERENTIAL/PLATELET
Basophils Absolute: 0.1 10*3/uL (ref 0.0–0.2)
Basos: 1 %
EOS (ABSOLUTE): 0.3 10*3/uL (ref 0.0–0.4)
Eos: 3 %
Hematocrit: 51.8 % — ABNORMAL HIGH (ref 37.5–51.0)
Hemoglobin: 17.4 g/dL (ref 13.0–17.7)
Immature Grans (Abs): 0 10*3/uL (ref 0.0–0.1)
Immature Granulocytes: 0 %
Lymphocytes Absolute: 2.1 10*3/uL (ref 0.7–3.1)
Lymphs: 24 %
MCH: 30.6 pg (ref 26.6–33.0)
MCHC: 33.6 g/dL (ref 31.5–35.7)
MCV: 91 fL (ref 79–97)
Monocytes Absolute: 0.9 10*3/uL (ref 0.1–0.9)
Monocytes: 11 %
Neutrophils Absolute: 5.5 10*3/uL (ref 1.4–7.0)
Neutrophils: 61 %
Platelets: 355 10*3/uL (ref 150–450)
RBC: 5.69 x10E6/uL (ref 4.14–5.80)
RDW: 12.2 % (ref 11.6–15.4)
WBC: 8.8 10*3/uL (ref 3.4–10.8)

## 2021-07-04 ENCOUNTER — Ambulatory Visit: Payer: Managed Care, Other (non HMO) | Admitting: Internal Medicine

## 2021-07-04 ENCOUNTER — Encounter: Payer: Self-pay | Admitting: Internal Medicine

## 2021-07-04 VITALS — BP 138/84 | HR 74 | Resp 18 | Ht 69.0 in | Wt 177.2 lb

## 2021-07-04 DIAGNOSIS — E782 Mixed hyperlipidemia: Secondary | ICD-10-CM

## 2021-07-04 DIAGNOSIS — I10 Essential (primary) hypertension: Secondary | ICD-10-CM

## 2021-07-04 DIAGNOSIS — Z720 Tobacco use: Secondary | ICD-10-CM

## 2021-07-04 DIAGNOSIS — J449 Chronic obstructive pulmonary disease, unspecified: Secondary | ICD-10-CM

## 2021-07-04 DIAGNOSIS — Z1211 Encounter for screening for malignant neoplasm of colon: Secondary | ICD-10-CM

## 2021-07-04 MED ORDER — ATORVASTATIN CALCIUM 20 MG PO TABS: 20.0000 mg | ORAL_TABLET | Freq: Every evening | ORAL | 1 refills | Status: DC

## 2021-07-04 MED ORDER — ALBUTEROL SULFATE HFA 108 (90 BASE) MCG/ACT IN AERS: 1.0000 | INHALATION_SPRAY | Freq: Four times a day (QID) | RESPIRATORY_TRACT | 5 refills | Status: DC | PRN

## 2021-07-04 NOTE — Patient Instructions (Addendum)
Please continue to take medications as prescribed.  Please continue to follow low salt diet and perform moderate exercise/walking at least 150 mins/week.  Please consider getting low-dose CT chest for lung cancer screening.

## 2021-07-04 NOTE — Assessment & Plan Note (Signed)
BP Readings from Last 1 Encounters:  07/04/21 138/84   Well-controlled with Amlodipine Counseled for compliance with the medications Advised DASH diet and moderate exercise/walking, at least 150 mins/week

## 2021-07-04 NOTE — Assessment & Plan Note (Addendum)
Well-controlled with Breztri and PRN Albuterol Discussed low-dose CT chest for lung cancer screening, wants to think about it.

## 2021-07-04 NOTE — Progress Notes (Signed)
Established Patient Office Visit  Subjective:  Patient ID: Donald Hale, male    DOB: 17-Aug-1955  Age: 66 y.o. MRN: [id number]  CC:  Chief Complaint  Patient presents with   Follow-up    6 month follow up     HPI Donald Hale is a 66 y.o. male with past medical history of HTN, COPD, HLD and tobacco abuse who presents for f/u of his chronic medical conditions.  HTN: BP is well-controlled. Takes medications regularly. Patient denies headache, dizziness, chest pain, dyspnea or palpitations.  COPD: He uses Breztri and as needed albuterol for dyspnea.  He denies any fever, chills, or wheezing currently.  He still smokes about 1 pack/day.  He blood test showed elevated LDL.  Of note, he has lot been taking Lipitor.  Past Medical History:  Diagnosis Date   Hypertension    Kidney stones    Sinusitis, chronic 09/27/2013    Past Surgical History:  Procedure Laterality Date   tonsillectomy  age 53    Family History  Problem Relation Age of Onset   Heart disease Mother     Social History   Socioeconomic History   Marital status: Married    Spouse name: Not on file   Number of children: Not on file   Years of education: Not on file   Highest education level: Not on file  Occupational History   Occupation: Dealer    Comment: Tax inspector  Tobacco Use   Smoking status: Every Day    Packs/day: 1.00    Years: 48.00    Pack years: 48.00    Types: Cigarettes   Smokeless tobacco: Former    Types: Chew    Quit date: 01/29/2004  Vaping Use   Vaping Use: Never used  Substance and Sexual Activity   Alcohol use: Not Currently   Drug use: No   Sexual activity: Yes  Other Topics Concern   Not on file  Social History Narrative   Not on file   Social Determinants of Health   Financial Resource Strain: Not on file  Food Insecurity: Not on file  Transportation Needs: Not on file  Physical Activity: Not on file  Stress: Not on file  Social Connections: Not on  file  Intimate Partner Violence: Not on file    Outpatient Medications Prior to Visit  Medication Sig Dispense Refill   amLODipine (NORVASC) 10 MG tablet TAKE 1 TABLET BY MOUTH EVERY DAY 90 tablet 1   Budeson-Glycopyrrol-Formoterol (BREZTRI AEROSPHERE) 160-9-4.8 MCG/ACT AERO Inhale 2 puffs into the lungs 2 (two) times daily. 10.7 g 11   Multiple Vitamin (MULTIVITAMIN WITH MINERALS) TABS tablet Take 1 tablet by mouth daily.     albuterol (VENTOLIN HFA) 108 (90 Base) MCG/ACT inhaler INHALE 2 PUFFS BY MOUTH EVERY 4 HOURS AS NEEDED FOR WHEEZE OR FOR SHORTNESS OF BREATH 8.5 each 1   atorvastatin (LIPITOR) 20 MG tablet Take 1 tablet (20 mg total) by mouth daily. 90 tablet 3   No facility-administered medications prior to visit.    Allergies  Allergen Reactions   Zyban [Bupropion]     Headache, CP    ROS Review of Systems  Constitutional:  Negative for chills and fever.  HENT:  Negative for congestion and sore throat.   Eyes:  Negative for pain and discharge.  Respiratory:  Positive for shortness of breath. Negative for cough.   Cardiovascular:  Negative for chest pain and palpitations.  Gastrointestinal:  Negative for constipation, diarrhea, nausea and vomiting.  Endocrine: Negative for polydipsia and polyuria.  Genitourinary:  Negative for dysuria and hematuria.  Musculoskeletal:  Negative for neck pain and neck stiffness.  Skin:  Negative for rash.  Neurological:  Negative for dizziness, weakness, numbness and headaches.  Psychiatric/Behavioral:  Negative for agitation and behavioral problems.      Objective:    Physical Exam Vitals reviewed.  Constitutional:      General: He is not in acute distress.    Appearance: He is not diaphoretic.  HENT:     Head: Normocephalic and atraumatic.     Nose: Nose normal.     Mouth/Throat:     Mouth: Mucous membranes are moist.  Eyes:     General: No scleral icterus.    Extraocular Movements: Extraocular movements intact.   Cardiovascular:     Rate and Rhythm: Normal rate and regular rhythm.     Heart sounds: Normal heart sounds. No murmur heard.   No gallop.  Pulmonary:     Breath sounds: Normal breath sounds. No wheezing or rales.  Abdominal:     General: There is distension.     Palpations: Abdomen is soft.     Tenderness: There is no abdominal tenderness.  Musculoskeletal:     Cervical back: Neck supple. No tenderness.     Right lower leg: No edema.     Left lower leg: No edema.  Skin:    General: Skin is warm.     Findings: Bruising (Over b/l UE) present. No rash.  Neurological:     General: No focal deficit present.     Mental Status: He is alert and oriented to person, place, and time.     Cranial Nerves: No cranial nerve deficit.     Sensory: No sensory deficit.     Motor: No weakness.  Psychiatric:        Mood and Affect: Mood normal.        Behavior: Behavior normal.    BP 138/84 (BP Location: Right Arm, Patient Position: Sitting, Cuff Size: Normal)   Pulse 74   Resp 18   Ht 5' 9"  (1.753 m)   Wt 177 lb 3.2 oz (80.4 kg)   SpO2 94%   BMI 26.17 kg/m  Wt Readings from Last 3 Encounters:  07/04/21 177 lb 3.2 oz (80.4 kg)  01/08/21 179 lb 1.9 oz (81.2 kg)  06/19/20 176 lb (79.8 kg)    Lab Results  Component Value Date   TSH 1.870 09/08/2014   Lab Results  Component Value Date   WBC 8.8 06/27/2021   HGB 17.4 06/27/2021   HCT 51.8 (H) 06/27/2021   MCV 91 06/27/2021   PLT 355 06/27/2021   Lab Results  Component Value Date   NA 143 06/27/2021   K 4.3 06/27/2021   CO2 22 06/27/2021   GLUCOSE 98 06/27/2021   BUN 11 06/27/2021   CREATININE 1.07 06/27/2021   BILITOT 0.7 06/27/2021   ALKPHOS 77 06/27/2021   AST 20 06/27/2021   ALT 12 06/27/2021   PROT 7.3 06/27/2021   ALBUMIN 4.9 (H) 06/27/2021   CALCIUM 9.6 06/27/2021   ANIONGAP 9 01/31/2020   EGFR 77 06/27/2021   Lab Results  Component Value Date   CHOL 189 06/27/2021   Lab Results  Component Value Date   HDL  45 06/27/2021   Lab Results  Component Value Date   LDLCALC 129 (H) 06/27/2021   Lab Results  Component Value Date   TRIG 81 06/27/2021   Lab  Results  Component Value Date   CHOLHDL 4.8 09/08/2018   Lab Results  Component Value Date   HGBA1C 5.3 10/10/2015      Assessment & Plan:   Problem List Items Addressed This Visit       Cardiovascular and Mediastinum   Essential hypertension, benign - Primary    BP Readings from Last 1 Encounters:  07/04/21 138/84  Well-controlled with Amlodipine Counseled for compliance with the medications Advised DASH diet and moderate exercise/walking, at least 150 mins/week       Relevant Medications   atorvastatin (LIPITOR) 20 MG tablet     Respiratory   COPD (chronic obstructive pulmonary disease) ([place])    Well-controlled with Breztri and PRN Albuterol Discussed low-dose CT chest for lung cancer screening, wants to think about it.       Relevant Medications   albuterol (VENTOLIN HFA) 108 (90 Base) MCG/ACT inhaler     Other   Tobacco abuse    Smokes about 1 pack/day  Asked about quitting: confirms that he/she currently smokes cigarettes Advise to quit smoking: Educated about QUITTING to reduce the risk of cancer, cardio and cerebrovascular disease. Assess willingness: Unwilling to quit at this time, but is working on cutting back. Assist with counseling and pharmacotherapy: Counseled for 5 minutes and literature provided. Arrange for follow up: follow up in 3 months and continue to offer help.      Hyperlipidemia    Has not been taking Atorvastatin, refilled Lipid profile reviewed       Relevant Medications   atorvastatin (LIPITOR) 20 MG tablet   Other Visit Diagnoses     Special screening for malignant neoplasms, colon       Relevant Orders   Cologuard       Meds ordered this encounter  Medications   atorvastatin (LIPITOR) 20 MG tablet    Sig: Take 1 tablet (20 mg total) by mouth every evening.     Dispense:  90 tablet    Refill:  1   albuterol (VENTOLIN HFA) 108 (90 Base) MCG/ACT inhaler    Sig: Inhale 1-2 puffs into the lungs every 6 (six) hours as needed for wheezing or shortness of breath.    Dispense:  18 each    Refill:  5    Follow-up: Return in about 6 months (around 01/03/2022) for Annual physical.    Lindell Spar, MD

## 2021-07-04 NOTE — Assessment & Plan Note (Signed)
Smokes about 1 pack/day  Asked about quitting: confirms that he/she currently smokes cigarettes Advise to quit smoking: Educated about QUITTING to reduce the risk of cancer, cardio and cerebrovascular disease. Assess willingness: Unwilling to quit at this time, but is working on cutting back. Assist with counseling and pharmacotherapy: Counseled for 5 minutes and literature provided. Arrange for follow up: follow up in 3 months and continue to offer help. 

## 2021-07-04 NOTE — Assessment & Plan Note (Signed)
Has not been taking Atorvastatin, refilled Lipid profile reviewed

## 2021-07-10 ENCOUNTER — Ambulatory Visit: Payer: Managed Care, Other (non HMO) | Admitting: Internal Medicine

## 2021-07-11 ENCOUNTER — Other Ambulatory Visit: Payer: Self-pay

## 2021-07-11 MED ORDER — ALBUTEROL SULFATE HFA 108 (90 BASE) MCG/ACT IN AERS: 1.0000 | INHALATION_SPRAY | Freq: Four times a day (QID) | RESPIRATORY_TRACT | 3 refills | Status: DC | PRN

## 2021-07-24 ENCOUNTER — Ambulatory Visit (INDEPENDENT_AMBULATORY_CARE_PROVIDER_SITE_OTHER): Payer: Managed Care, Other (non HMO) | Admitting: *Deleted

## 2021-07-24 DIAGNOSIS — Z23 Encounter for immunization: Secondary | ICD-10-CM

## 2021-08-04 LAB — COLOGUARD: COLOGUARD: NEGATIVE

## 2021-11-03 ENCOUNTER — Other Ambulatory Visit: Payer: Self-pay | Admitting: Internal Medicine

## 2021-11-03 DIAGNOSIS — I10 Essential (primary) hypertension: Secondary | ICD-10-CM

## 2021-11-03 DIAGNOSIS — E782 Mixed hyperlipidemia: Secondary | ICD-10-CM

## 2021-11-16 ENCOUNTER — Other Ambulatory Visit: Payer: Self-pay

## 2021-11-16 DIAGNOSIS — J449 Chronic obstructive pulmonary disease, unspecified: Secondary | ICD-10-CM

## 2021-11-16 MED ORDER — BREZTRI AEROSPHERE 160-9-4.8 MCG/ACT IN AERO: 2.0000 | INHALATION_SPRAY | Freq: Two times a day (BID) | RESPIRATORY_TRACT | 11 refills | Status: DC

## 2021-11-16 MED ORDER — ALBUTEROL SULFATE HFA 108 (90 BASE) MCG/ACT IN AERS: 1.0000 | INHALATION_SPRAY | Freq: Four times a day (QID) | RESPIRATORY_TRACT | 3 refills | Status: DC | PRN

## 2022-01-04 IMAGING — DX DG CHEST 1V PORT
1 series · 1 of 1 positions shown · non-contrast
Comparison: 09/21/2013.

CLINICAL DATA: Shortness of breath

EXAM:
PORTABLE CHEST 1 VIEW

[chest ap]
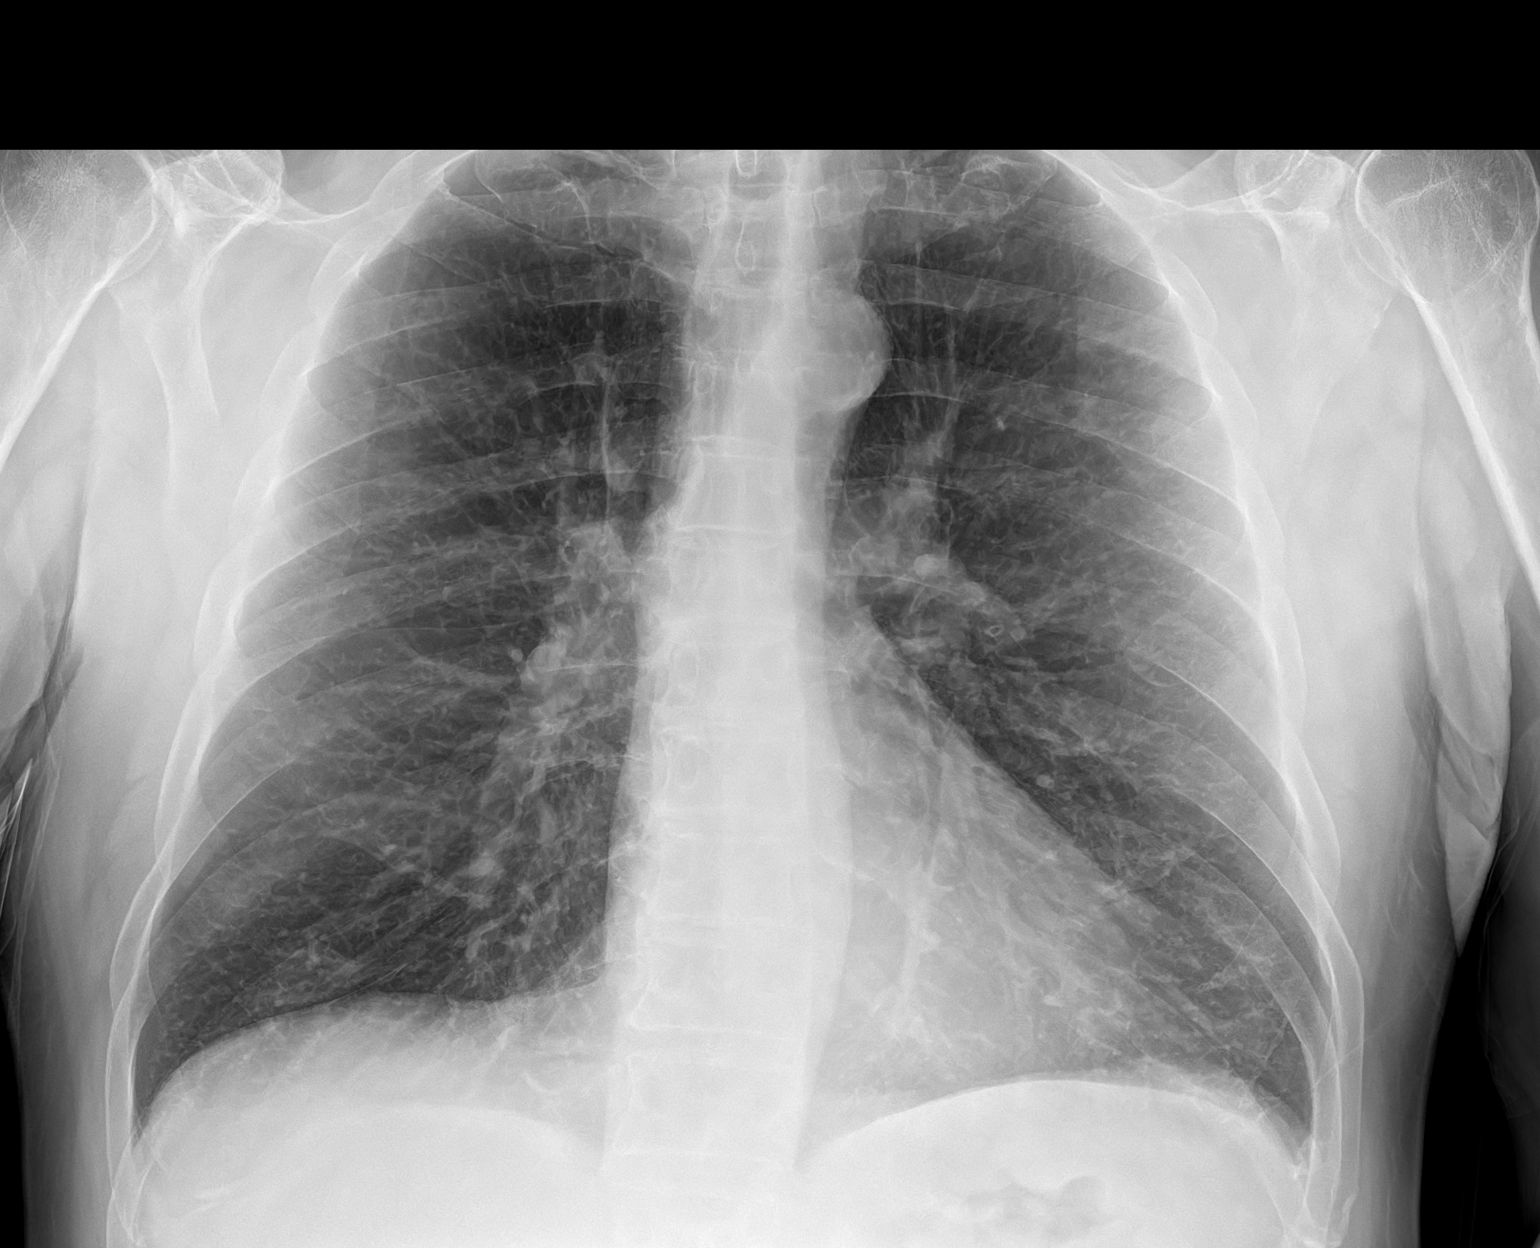

[1 of 1 positions shown; findings below may reference images not displayed]

FINDINGS: No focal consolidation. No pneumothorax or pleural effusion.
Cardiomediastinal silhouette is within normal limits. No acute
osseous abnormality.
IMPRESSION: No focal airspace disease.

## 2022-01-10 ENCOUNTER — Encounter: Payer: Self-pay | Admitting: Internal Medicine

## 2022-01-10 ENCOUNTER — Ambulatory Visit (INDEPENDENT_AMBULATORY_CARE_PROVIDER_SITE_OTHER): Payer: Managed Care, Other (non HMO) | Admitting: Internal Medicine

## 2022-01-10 VITALS — BP 120/71 | HR 80 | Ht 69.0 in | Wt 173.0 lb

## 2022-01-10 DIAGNOSIS — Z0001 Encounter for general adult medical examination with abnormal findings: Secondary | ICD-10-CM

## 2022-01-10 DIAGNOSIS — E782 Mixed hyperlipidemia: Secondary | ICD-10-CM | POA: Diagnosis not present

## 2022-01-10 DIAGNOSIS — Z720 Tobacco use: Secondary | ICD-10-CM

## 2022-01-10 DIAGNOSIS — Z125 Encounter for screening for malignant neoplasm of prostate: Secondary | ICD-10-CM

## 2022-01-10 DIAGNOSIS — I10 Essential (primary) hypertension: Secondary | ICD-10-CM

## 2022-01-10 DIAGNOSIS — R7303 Prediabetes: Secondary | ICD-10-CM

## 2022-01-10 DIAGNOSIS — J449 Chronic obstructive pulmonary disease, unspecified: Secondary | ICD-10-CM | POA: Diagnosis not present

## 2022-01-10 DIAGNOSIS — Z122 Encounter for screening for malignant neoplasm of respiratory organs: Secondary | ICD-10-CM

## 2022-01-10 DIAGNOSIS — E559 Vitamin D deficiency, unspecified: Secondary | ICD-10-CM

## 2022-01-10 DIAGNOSIS — Z2821 Immunization not carried out because of patient refusal: Secondary | ICD-10-CM

## 2022-01-10 NOTE — Patient Instructions (Addendum)
Please continue to take medications as prescribed.  Please continue to follow low salt diet and perform moderate exercise/walking at least 150 mins/week.  Please get fasting blood tests done within a week.

## 2022-01-11 DIAGNOSIS — Z125 Encounter for screening for malignant neoplasm of prostate: Secondary | ICD-10-CM | POA: Insufficient documentation

## 2022-01-11 DIAGNOSIS — Z0001 Encounter for general adult medical examination with abnormal findings: Secondary | ICD-10-CM | POA: Insufficient documentation

## 2022-01-11 NOTE — Assessment & Plan Note (Signed)
Takes Atorvastatin, refilled Lipid profile reviewed

## 2022-01-11 NOTE — Assessment & Plan Note (Signed)
Physical exam as documented. Fasting blood tests ordered. Refused flu and Shingrix vaccine. 

## 2022-01-11 NOTE — Assessment & Plan Note (Signed)
Ordered PSA after discussing its limitations for prostate cancer screening, including false positive results leading additional investigations. 

## 2022-01-11 NOTE — Assessment & Plan Note (Signed)
Smokes about 1 pack/day  Asked about quitting: confirms that he/she currently smokes cigarettes Advise to quit smoking: Educated about QUITTING to reduce the risk of cancer, cardio and cerebrovascular disease. Assess willingness: Unwilling to quit at this time, but is working on cutting back. Assist with counseling and pharmacotherapy: Counseled for 5 minutes and literature provided. Arrange for follow up: follow up in 3 months and continue to offer help. 

## 2022-01-11 NOTE — Assessment & Plan Note (Signed)
Well-controlled with Breztri and PRN Albuterol Discussed low-dose CT chest for lung cancer screening

## 2022-01-11 NOTE — Assessment & Plan Note (Signed)
BP Readings from Last 1 Encounters:  01/10/22 120/71   Well-controlled with Amlodipine Counseled for compliance with the medications Advised DASH diet and moderate exercise/walking, at least 150 mins/week

## 2022-01-11 NOTE — Progress Notes (Addendum)
Established Patient Office Visit  Subjective:  Patient ID: Donald Hale, male    DOB: 1955/10/25  Age: 66 y.o. MRN: [id number]  CC:  Chief Complaint  Patient presents with   Annual Exam    HPI Donald Hale is a 66 y.o. male with past medical history of HTN, COPD, HLD and tobacco abuse who presents for annual physical.  HTN: BP is well-controlled. Takes medications regularly. Patient denies headache, dizziness, chest pain, dyspnea or palpitations.   COPD: He uses Breztri and as needed albuterol for dyspnea.  He denies any fever, chills, or wheezing currently.  He still smokes about 1 pack/day.    Past Medical History:  Diagnosis Date   Hypertension    Kidney stones    Sinusitis, chronic 09/27/2013    Past Surgical History:  Procedure Laterality Date   tonsillectomy  age 21    Family History  Problem Relation Age of Onset   Heart disease Mother     Social History   Socioeconomic History   Marital status: Married    Spouse name: Not on file   Number of children: Not on file   Years of education: Not on file   Highest education level: Not on file  Occupational History   Occupation: Dealer    Comment: Tax inspector  Tobacco Use   Smoking status: Every Day    Packs/day: 1.00    Years: 48.00    Total pack years: 48.00    Types: Cigarettes   Smokeless tobacco: Former    Types: Chew    Quit date: 01/29/2004  Vaping Use   Vaping Use: Never used  Substance and Sexual Activity   Alcohol use: Not Currently   Drug use: No   Sexual activity: Yes  Other Topics Concern   Not on file  Social History Narrative   Not on file   Social Determinants of Health   Financial Resource Strain: Not on file  Food Insecurity: Not on file  Transportation Needs: Not on file  Physical Activity: Not on file  Stress: Not on file  Social Connections: Not on file  Intimate Partner Violence: Not on file    Outpatient Medications Prior to Visit  Medication Sig  Dispense Refill   albuterol (PROVENTIL HFA) 108 (90 Base) MCG/ACT inhaler Inhale 1-2 puffs into the lungs every 6 (six) hours as needed for wheezing or shortness of breath. 18 g 3   albuterol (VENTOLIN HFA) 108 (90 Base) MCG/ACT inhaler Inhale 1-2 puffs into the lungs every 6 (six) hours as needed for wheezing or shortness of breath. 18 each 5   amLODipine (NORVASC) 10 MG tablet TAKE 1 TABLET BY MOUTH EVERY DAY 90 tablet 1   atorvastatin (LIPITOR) 20 MG tablet TAKE 1 TABLET BY MOUTH EVERY DAY IN THE EVENING 90 tablet 1   Budeson-Glycopyrrol-Formoterol (BREZTRI AEROSPHERE) 160-9-4.8 MCG/ACT AERO Inhale 2 puffs into the lungs 2 (two) times daily. 10.7 g 11   Multiple Vitamin (MULTIVITAMIN WITH MINERALS) TABS tablet Take 1 tablet by mouth daily.     No facility-administered medications prior to visit.    Allergies  Allergen Reactions   Zyban [Bupropion]     Headache, CP    ROS Review of Systems  Constitutional:  Negative for chills and fever.  HENT:  Negative for congestion and sore throat.   Eyes:  Negative for pain and discharge.  Respiratory:  Negative for cough and shortness of breath.   Cardiovascular:  Negative for chest pain and palpitations.  Gastrointestinal:  Negative for constipation, diarrhea, nausea and vomiting.  Endocrine: Negative for polydipsia and polyuria.  Genitourinary:  Negative for dysuria and hematuria.  Musculoskeletal:  Negative for neck pain and neck stiffness.  Skin:  Negative for rash.  Neurological:  Negative for dizziness, weakness, numbness and headaches.  Psychiatric/Behavioral:  Negative for agitation and behavioral problems.       Objective:    Physical Exam Vitals reviewed.  Constitutional:      General: He is not in acute distress.    Appearance: He is not diaphoretic.  HENT:     Head: Normocephalic and atraumatic.     Nose: Nose normal.     Mouth/Throat:     Mouth: Mucous membranes are moist.  Eyes:     General: No scleral icterus.     Extraocular Movements: Extraocular movements intact.  Cardiovascular:     Rate and Rhythm: Normal rate and regular rhythm.     Heart sounds: Normal heart sounds. No murmur heard.    No gallop.  Pulmonary:     Breath sounds: Normal breath sounds. No wheezing or rales.  Abdominal:     Palpations: Abdomen is soft.     Tenderness: There is no abdominal tenderness.  Musculoskeletal:     Cervical back: Neck supple. No tenderness.     Right lower leg: No edema.     Left lower leg: No edema.  Skin:    General: Skin is warm.     Findings: Bruising (Over b/l UE) present. No rash.  Neurological:     General: No focal deficit present.     Mental Status: He is alert and oriented to person, place, and time.     Cranial Nerves: No cranial nerve deficit.     Sensory: No sensory deficit.     Motor: No weakness.  Psychiatric:        Mood and Affect: Mood normal.        Behavior: Behavior normal.     BP 120/71 (BP Location: Left Arm, Patient Position: Sitting, Cuff Size: Normal)   Pulse 80   Ht _0  (1.753 m)   Wt 173 lb (78.5 kg)   SpO2 (!) 89%   BMI 25.55 kg/m  Wt Readings from Last 3 Encounters:  01/10/22 173 lb (78.5 kg)  07/04/21 177 lb 3.2 oz (80.4 kg)  01/08/21 179 lb 1.9 oz (81.2 kg)    Lab Results  Component Value Date   TSH 1.870 09/08/2014   Lab Results  Component Value Date   WBC 8.8 06/27/2021   HGB 17.4 06/27/2021   HCT 51.8 (H) 06/27/2021   MCV 91 06/27/2021   PLT 355 06/27/2021   Lab Results  Component Value Date   NA 143 06/27/2021   K 4.3 06/27/2021   CO2 22 06/27/2021   GLUCOSE 98 06/27/2021   BUN 11 06/27/2021   CREATININE 1.07 06/27/2021   BILITOT 0.7 06/27/2021   ALKPHOS 77 06/27/2021   AST 20 06/27/2021   ALT 12 06/27/2021   PROT 7.3 06/27/2021   ALBUMIN 4.9 (H) 06/27/2021   CALCIUM 9.6 06/27/2021   ANIONGAP 9 01/31/2020   EGFR 77 06/27/2021   Lab Results  Component Value Date   CHOL 189 06/27/2021   Lab Results  Component Value Date    HDL 45 06/27/2021   Lab Results  Component Value Date   LDLCALC 129 (H) 06/27/2021   Lab Results  Component Value Date   TRIG 81 06/27/2021   Lab  Results  Component Value Date   CHOLHDL 4.8 09/08/2018   Lab Results  Component Value Date   HGBA1C 5.3 10/10/2015      Assessment & Plan:   Problem List Items Addressed This Visit       Cardiovascular and Mediastinum   Essential hypertension, benign    BP Readings from Last 1 Encounters:  01/10/22 120/71  Well-controlled with Amlodipine Counseled for compliance with the medications Advised DASH diet and moderate exercise/walking, at least 150 mins/week       Relevant Orders   TSH   CMP14+EGFR     Respiratory   COPD (chronic obstructive pulmonary disease) ([place])    Well-controlled with Breztri and PRN Albuterol Discussed low-dose CT chest for lung cancer screening      Relevant Orders   TSH   CBC with Differential/Platelet     Other   Tobacco abuse    Smokes about 1 pack/day  Asked about quitting: confirms that he/she currently smokes cigarettes Advise to quit smoking: Educated about QUITTING to reduce the risk of cancer, cardio and cerebrovascular disease. Assess willingness: Unwilling to quit at this time, but is working on cutting back. Assist with counseling and pharmacotherapy: Counseled for 5 minutes and literature provided. Arrange for follow up: follow up in 3 months and continue to offer help.      Hyperlipidemia    Takes Atorvastatin, refilled Lipid profile reviewed      Relevant Orders   Lipid panel   Encounter for general adult medical examination with abnormal findings - Primary    Physical exam as documented. Fasting blood tests ordered. Refused flu and Shingrix vaccine.      Relevant Orders   TSH   CMP14+EGFR   CBC with Differential/Platelet   Prostate cancer screening    Ordered PSA after discussing its limitations for prostate cancer screening, including false positive results  leading additional investigations.      Relevant Orders   PSA   Other Visit Diagnoses     Prediabetes       Relevant Orders   Hemoglobin A1c   Vitamin D deficiency       Relevant Orders   VITAMIN D 25 Hydroxy (Vit-D Deficiency, Fractures)   Refused influenza vaccine       Screening for lung cancer       Relevant Orders   CT CHEST LUNG CANCER SCREENING LOW DOSE WO CONTRAST       No orders of the defined types were placed in this encounter.   Follow-up: Return in about 6 months (around 07/12/2022) for HTN and COPD.    Lindell Spar, MD

## 2022-01-31 ENCOUNTER — Ambulatory Visit (HOSPITAL_COMMUNITY): Payer: Managed Care, Other (non HMO)

## 2022-02-19 ENCOUNTER — Telehealth: Payer: Self-pay | Admitting: Internal Medicine

## 2022-02-19 LAB — LIPID PANEL
Chol/HDL Ratio: 2.7 ratio (ref 0.0–5.0)
Cholesterol, Total: 125 mg/dL (ref 100–199)
HDL: 47 mg/dL (ref >39)
LDL Chol Calc (NIH): 65 mg/dL (ref 0–99)
Triglycerides: 59 mg/dL (ref 0–149)
VLDL Cholesterol Cal: 13 mg/dL (ref 5–40)

## 2022-02-19 LAB — CBC WITH DIFFERENTIAL/PLATELET
Basophils Absolute: 0.1 10*3/uL (ref 0.0–0.2)
Basos: 1 %
EOS (ABSOLUTE): 0.2 10*3/uL (ref 0.0–0.4)
Eos: 3 %
Hematocrit: 49.7 % (ref 37.5–51.0)
Hemoglobin: 16.8 g/dL (ref 13.0–17.7)
Immature Grans (Abs): 0 10*3/uL (ref 0.0–0.1)
Immature Granulocytes: 0 %
Lymphocytes Absolute: 1.5 10*3/uL (ref 0.7–3.1)
Lymphs: 20 %
MCH: 30.6 pg (ref 26.6–33.0)
MCHC: 33.8 g/dL (ref 31.5–35.7)
MCV: 91 fL (ref 79–97)
Monocytes Absolute: 0.7 10*3/uL (ref 0.1–0.9)
Monocytes: 9 %
Neutrophils Absolute: 5.1 10*3/uL (ref 1.4–7.0)
Neutrophils: 67 %
Platelets: 385 10*3/uL (ref 150–450)
RBC: 5.49 x10E6/uL (ref 4.14–5.80)
RDW: 11.8 % (ref 11.6–15.4)
WBC: 7.6 10*3/uL (ref 3.4–10.8)

## 2022-02-19 LAB — CMP14+EGFR
ALT: 13 IU/L (ref 0–44)
AST: 18 IU/L (ref 0–40)
Albumin/Globulin Ratio: 1.9 (ref 1.2–2.2)
Albumin: 4.5 g/dL (ref 3.9–4.9)
Alkaline Phosphatase: 90 IU/L (ref 44–121)
BUN/Creatinine Ratio: 9 — ABNORMAL LOW (ref 10–24)
BUN: 10 mg/dL (ref 8–27)
Bilirubin Total: 0.9 mg/dL (ref 0.0–1.2)
CO2: 25 mmol/L (ref 20–29)
Calcium: 9.9 mg/dL (ref 8.6–10.2)
Chloride: 103 mmol/L (ref 96–106)
Creatinine, Ser: 1.08 mg/dL (ref 0.76–1.27)
Globulin, Total: 2.4 g/dL (ref 1.5–4.5)
Glucose: 102 mg/dL — ABNORMAL HIGH (ref 70–99)
Potassium: 4.8 mmol/L (ref 3.5–5.2)
Sodium: 142 mmol/L (ref 134–144)
Total Protein: 6.9 g/dL (ref 6.0–8.5)
eGFR: 76 mL/min/{1.73_m2} (ref >59)

## 2022-02-19 LAB — VITAMIN D 25 HYDROXY (VIT D DEFICIENCY, FRACTURES): Vit D, 25-Hydroxy: 20.5 ng/mL — ABNORMAL LOW (ref 30.0–100.0)

## 2022-02-19 LAB — HEMOGLOBIN A1C
Est. average glucose Bld gHb Est-mCnc: 123 mg/dL
Hgb A1c MFr Bld: 5.9 % — ABNORMAL HIGH (ref 4.8–5.6)

## 2022-02-19 LAB — PSA: Prostate Specific Ag, Serum: 0.4 ng/mL (ref 0.0–4.0)

## 2022-02-19 LAB — TSH: TSH: 2.91 u[IU]/mL (ref 0.450–4.500)

## 2022-02-19 NOTE — Telephone Encounter (Signed)
Pt returning call for labs 

## 2022-02-19 NOTE — Telephone Encounter (Signed)
Spoke to patient

## 2022-03-01 ENCOUNTER — Ambulatory Visit (HOSPITAL_COMMUNITY)
Admission: RE | Admit: 2022-03-01 | Discharge: 2022-03-01 | Disposition: A | Discharge status: Home / Self Care | Payer: Managed Care, Other (non HMO) | Source: Ambulatory Visit | Attending: Internal Medicine | Admitting: Internal Medicine

## 2022-03-01 DIAGNOSIS — Z122 Encounter for screening for malignant neoplasm of respiratory organs: Secondary | ICD-10-CM

## 2022-03-04 ENCOUNTER — Encounter: Payer: Self-pay | Admitting: Internal Medicine

## 2022-03-04 DIAGNOSIS — I700 Atherosclerosis of aorta: Secondary | ICD-10-CM | POA: Insufficient documentation

## 2022-03-30 ENCOUNTER — Other Ambulatory Visit: Payer: Self-pay | Admitting: Internal Medicine

## 2022-03-30 DIAGNOSIS — I10 Essential (primary) hypertension: Secondary | ICD-10-CM

## 2022-06-26 ENCOUNTER — Other Ambulatory Visit: Payer: Self-pay | Admitting: Internal Medicine

## 2022-06-26 DIAGNOSIS — E782 Mixed hyperlipidemia: Secondary | ICD-10-CM

## 2022-07-15 ENCOUNTER — Ambulatory Visit: Payer: Managed Care, Other (non HMO) | Admitting: Internal Medicine

## 2022-07-25 ENCOUNTER — Ambulatory Visit: Payer: Managed Care, Other (non HMO) | Admitting: Internal Medicine

## 2022-07-25 ENCOUNTER — Encounter: Payer: Self-pay | Admitting: Internal Medicine

## 2022-07-25 VITALS — BP 122/71 | HR 80 | Ht 69.0 in | Wt 173.2 lb

## 2022-07-25 DIAGNOSIS — R252 Cramp and spasm: Secondary | ICD-10-CM

## 2022-07-25 DIAGNOSIS — E782 Mixed hyperlipidemia: Secondary | ICD-10-CM | POA: Diagnosis not present

## 2022-07-25 DIAGNOSIS — I700 Atherosclerosis of aorta: Secondary | ICD-10-CM | POA: Diagnosis not present

## 2022-07-25 DIAGNOSIS — J449 Chronic obstructive pulmonary disease, unspecified: Secondary | ICD-10-CM | POA: Diagnosis not present

## 2022-07-25 DIAGNOSIS — Z720 Tobacco use: Secondary | ICD-10-CM

## 2022-07-25 DIAGNOSIS — I10 Essential (primary) hypertension: Secondary | ICD-10-CM | POA: Diagnosis not present

## 2022-07-25 DIAGNOSIS — E559 Vitamin D deficiency, unspecified: Secondary | ICD-10-CM | POA: Insufficient documentation

## 2022-07-25 NOTE — Assessment & Plan Note (Addendum)
Well-controlled with Breztri and PRN Albuterol Discussed low-dose CT chest

## 2022-07-25 NOTE — Patient Instructions (Addendum)
Please take Magnesium oxide 200 mg once daily and CoQ10 - 100 mg once daily.  Please continue to take medications as prescribed.  Please continue to follow low salt diet and perform moderate exercise/walking at least 150 mins/week.  Please try to cut down -> quit smoking.  You can call 1-800-QUIT-NOW for smoking cessation related supplies.

## 2022-07-25 NOTE — Assessment & Plan Note (Signed)
Could be due to electrolyte deficiencies and/or statin Advised to take magnesium supplement and co-Q10

## 2022-07-25 NOTE — Assessment & Plan Note (Signed)
BP Readings from Last 1 Encounters:  07/25/22 122/71   Well-controlled with Amlodipine Counseled for compliance with the medications Advised DASH diet and moderate exercise/walking, at least 150 mins/week

## 2022-07-25 NOTE — Assessment & Plan Note (Signed)
Smokes about 1 pack/day  Asked about quitting: confirms that he/she currently smokes cigarettes Advise to quit smoking: Educated about QUITTING to reduce the risk of cancer, cardio and cerebrovascular disease. Assess willingness: Unwilling to quit at this time, but is working on cutting back. Assist with counseling and pharmacotherapy: Counseled for 5 minutes and literature provided. Arrange for follow up: follow up in 3 months and continue to offer help. 

## 2022-07-25 NOTE — Assessment & Plan Note (Signed)
Takes Atorvastatin, refilled Lipid profile reviewed 

## 2022-07-25 NOTE — Assessment & Plan Note (Signed)
Advised to take Vitamin D 2000 IU QD °

## 2022-07-25 NOTE — Progress Notes (Signed)
Established Patient Office Visit  Subjective:  Patient ID: Donald Hale, male    DOB: [birth year]  Age: 67 y.o. MRN: [id number]  CC:  Chief Complaint  Patient presents with   Hypertension    Follow up    COPD    Follow up     HPI Donald Hale is a 67 y.o. male with past medical history of HTN, COPD, HLD and tobacco abuse who presents for f/u of his chronic medical conditions.  HTN: BP is well-controlled. Takes medications regularly. Patient denies headache, dizziness, chest pain, dyspnea or palpitations.  COPD: He uses Breztri and as needed albuterol for dyspnea.  He denies any fever, chills, or wheezing currently.  He still smokes about 1 pack/day.  HLD: He takes atorvastatin regularly now.  Past Medical History:  Diagnosis Date   Hypertension    Kidney stones    Sinusitis, chronic 09/27/2013    Past Surgical History:  Procedure Laterality Date   tonsillectomy  age 69    Family History  Problem Relation Age of Onset   Heart disease Mother     Social History   Socioeconomic History   Marital status: Married    Spouse name: Not on file   Number of children: Not on file   Years of education: Not on file   Highest education level: Not on file  Occupational History   Occupation: Curator    Comment: Patent examiner  Tobacco Use   Smoking status: Every Day    Packs/day: 1.00    Years: 48.00    Additional pack years: 0.00    Total pack years: 48.00    Types: Cigarettes   Smokeless tobacco: Former    Types: Chew    Quit date: 01/29/2004  Vaping Use   Vaping Use: Never used  Substance and Sexual Activity   Alcohol use: Not Currently   Drug use: No   Sexual activity: Yes  Other Topics Concern   Not on file  Social History Narrative   Not on file   Social Determinants of Health   Financial Resource Strain: Not on file  Food Insecurity: Not on file  Transportation Needs: Not on file  Physical Activity: Not on file  Stress: Not on file  Social  Connections: Not on file  Intimate Partner Violence: Not on file    Outpatient Medications Prior to Visit  Medication Sig Dispense Refill   albuterol (PROVENTIL HFA) 108 (90 Base) MCG/ACT inhaler Inhale 1-2 puffs into the lungs every 6 (six) hours as needed for wheezing or shortness of breath. 18 g 3   albuterol (VENTOLIN HFA) 108 (90 Base) MCG/ACT inhaler Inhale 1-2 puffs into the lungs every 6 (six) hours as needed for wheezing or shortness of breath. 18 each 5   amLODipine (NORVASC) 10 MG tablet TAKE 1 TABLET BY MOUTH EVERY DAY 90 tablet 1   atorvastatin (LIPITOR) 20 MG tablet TAKE 1 TABLET BY MOUTH EVERY DAY IN THE EVENING 90 tablet 1   Budeson-Glycopyrrol-Formoterol (BREZTRI AEROSPHERE) 160-9-4.8 MCG/ACT AERO Inhale 2 puffs into the lungs 2 (two) times daily. 10.7 g 11   Multiple Vitamin (MULTIVITAMIN WITH MINERALS) TABS tablet Take 1 tablet by mouth daily.     No facility-administered medications prior to visit.    Allergies  Allergen Reactions   Zyban [Bupropion]     Headache, CP    ROS Review of Systems  Constitutional:  Negative for chills and fever.  HENT:  Negative for congestion and sore throat.  Eyes:  Negative for pain and discharge.  Respiratory:  Positive for shortness of breath. Negative for cough.   Cardiovascular:  Negative for chest pain and palpitations.  Gastrointestinal:  Negative for constipation, diarrhea, nausea and vomiting.  Endocrine: Negative for polydipsia and polyuria.  Genitourinary:  Negative for dysuria and hematuria.  Musculoskeletal:  Negative for neck pain and neck stiffness.  Skin:  Negative for rash.  Neurological:  Negative for dizziness, weakness, numbness and headaches.  Psychiatric/Behavioral:  Negative for agitation and behavioral problems.       Objective:    Physical Exam Vitals reviewed.  Constitutional:      General: He is not in acute distress.    Appearance: He is not diaphoretic.  HENT:     Head: Normocephalic and  atraumatic.     Nose: Nose normal.     Mouth/Throat:     Mouth: Mucous membranes are moist.  Eyes:     General: No scleral icterus.    Extraocular Movements: Extraocular movements intact.  Cardiovascular:     Rate and Rhythm: Normal rate and regular rhythm.     Heart sounds: Normal heart sounds. No murmur heard.    No gallop.  Pulmonary:     Breath sounds: Normal breath sounds. No wheezing or rales.  Abdominal:     Palpations: Abdomen is soft.     Tenderness: There is no abdominal tenderness.  Musculoskeletal:     Cervical back: Neck supple. No tenderness.     Right lower leg: No edema.     Left lower leg: No edema.  Skin:    General: Skin is warm.     Findings: Bruising (Over b/l UE) present. No rash.  Neurological:     General: No focal deficit present.     Mental Status: He is alert and oriented to person, place, and time.     Cranial Nerves: No cranial nerve deficit.     Sensory: No sensory deficit.     Motor: No weakness.  Psychiatric:        Mood and Affect: Mood normal.        Behavior: Behavior normal.     BP 122/71 (BP Location: Left Arm, Patient Position: Sitting, Cuff Size: Normal)   Pulse 80   Ht 5\' 9"  (1.753 m)   Wt 173 lb 3.2 oz (78.6 kg)   SpO2 91%   BMI 25.58 kg/m  Wt Readings from Last 3 Encounters:  07/25/22 173 lb 3.2 oz (78.6 kg)  01/10/22 173 lb (78.5 kg)  07/04/21 177 lb 3.2 oz (80.4 kg)    Lab Results  Component Value Date   TSH 2.910 02/18/2022   Lab Results  Component Value Date   WBC 7.6 02/18/2022   HGB 16.8 02/18/2022   HCT 49.7 02/18/2022   MCV 91 02/18/2022   PLT 385 02/18/2022   Lab Results  Component Value Date   NA 142 02/18/2022   K 4.8 02/18/2022   CO2 25 02/18/2022   GLUCOSE 102 (H) 02/18/2022   BUN 10 02/18/2022   CREATININE 1.08 02/18/2022   BILITOT 0.9 02/18/2022   ALKPHOS 90 02/18/2022   AST 18 02/18/2022   ALT 13 02/18/2022   PROT 6.9 02/18/2022   ALBUMIN 4.5 02/18/2022   CALCIUM 9.9 02/18/2022    ANIONGAP 9 01/31/2020   EGFR 76 02/18/2022   Lab Results  Component Value Date   CHOL 125 02/18/2022   Lab Results  Component Value Date   HDL 47 02/18/2022  Lab Results  Component Value Date   LDLCALC 65 02/18/2022   Lab Results  Component Value Date   TRIG 59 02/18/2022   Lab Results  Component Value Date   CHOLHDL 2.7 02/18/2022   Lab Results  Component Value Date   HGBA1C 5.9 (H) 02/18/2022      Assessment & Plan:   Problem List Items Addressed This Visit       Cardiovascular and Mediastinum   Essential hypertension, benign - Primary    BP Readings from Last 1 Encounters:  07/25/22 122/71  Well-controlled with Amlodipine Counseled for compliance with the medications Advised DASH diet and moderate exercise/walking, at least 150 mins/week      Atherosclerosis of aorta ([facility])    Noted on CT chest BP wnl, on Atorvastatin for HLD        Respiratory   COPD (chronic obstructive pulmonary disease) ([facility])    Well-controlled with Breztri and PRN Albuterol Discussed low-dose CT chest        Other   Tobacco abuse    Smokes about 1 pack/day  Asked about quitting: confirms that he/she currently smokes cigarettes Advise to quit smoking: Educated about QUITTING to reduce the risk of cancer, cardio and cerebrovascular disease. Assess willingness: Unwilling to quit at this time, but is working on cutting back. Assist with counseling and pharmacotherapy: Counseled for 5 minutes and literature provided. Arrange for follow up: follow up in 3 months and continue to offer help.      Hyperlipidemia    Takes Atorvastatin, refilled Lipid profile reviewed      Vitamin D deficiency    Advised to take Vitamin D 2000 IU QD      Leg cramps    Could be due to electrolyte deficiencies and/or statin Advised to take magnesium supplement and co-Q10      No orders of the defined types were placed in this encounter.   Follow-up: Return in about 6 months (around  01/24/2023) for Annual physical.    Anabel Halon, MD

## 2022-07-25 NOTE — Assessment & Plan Note (Addendum)
Noted on CT chest BP wnl, on Atorvastatin for HLD

## 2022-11-01 ENCOUNTER — Other Ambulatory Visit: Payer: Self-pay | Admitting: Internal Medicine

## 2022-11-10 ENCOUNTER — Other Ambulatory Visit: Payer: Self-pay | Admitting: Internal Medicine

## 2022-11-10 DIAGNOSIS — I10 Essential (primary) hypertension: Secondary | ICD-10-CM

## 2022-12-23 ENCOUNTER — Other Ambulatory Visit: Payer: Self-pay | Admitting: Internal Medicine

## 2022-12-23 DIAGNOSIS — E782 Mixed hyperlipidemia: Secondary | ICD-10-CM

## 2022-12-25 ENCOUNTER — Other Ambulatory Visit: Payer: Self-pay | Admitting: Internal Medicine

## 2022-12-25 DIAGNOSIS — J449 Chronic obstructive pulmonary disease, unspecified: Secondary | ICD-10-CM

## 2022-12-28 ENCOUNTER — Other Ambulatory Visit: Payer: Self-pay | Admitting: Internal Medicine

## 2023-01-27 ENCOUNTER — Ambulatory Visit (INDEPENDENT_AMBULATORY_CARE_PROVIDER_SITE_OTHER): Payer: Managed Care, Other (non HMO) | Admitting: Internal Medicine

## 2023-01-27 ENCOUNTER — Encounter: Payer: Self-pay | Admitting: Internal Medicine

## 2023-01-27 VITALS — BP 126/73 | HR 78 | Ht 69.0 in | Wt 170.2 lb

## 2023-01-27 DIAGNOSIS — I700 Atherosclerosis of aorta: Secondary | ICD-10-CM | POA: Diagnosis not present

## 2023-01-27 DIAGNOSIS — R7303 Prediabetes: Secondary | ICD-10-CM | POA: Insufficient documentation

## 2023-01-27 DIAGNOSIS — Z720 Tobacco use: Secondary | ICD-10-CM

## 2023-01-27 DIAGNOSIS — J449 Chronic obstructive pulmonary disease, unspecified: Secondary | ICD-10-CM

## 2023-01-27 DIAGNOSIS — Z0001 Encounter for general adult medical examination with abnormal findings: Secondary | ICD-10-CM

## 2023-01-27 DIAGNOSIS — I10 Essential (primary) hypertension: Secondary | ICD-10-CM | POA: Diagnosis not present

## 2023-01-27 DIAGNOSIS — Z125 Encounter for screening for malignant neoplasm of prostate: Secondary | ICD-10-CM

## 2023-01-27 DIAGNOSIS — E559 Vitamin D deficiency, unspecified: Secondary | ICD-10-CM

## 2023-01-27 DIAGNOSIS — E782 Mixed hyperlipidemia: Secondary | ICD-10-CM

## 2023-01-27 MED ORDER — VARENICLINE TARTRATE 1 MG PO TABS: 1.0000 mg | ORAL_TABLET | Freq: Two times a day (BID) | ORAL | 2 refills | Status: DC

## 2023-01-27 MED ORDER — METHYLPREDNISOLONE 4 MG PO TBPK: ORAL_TABLET | ORAL | 0 refills | Status: DC

## 2023-01-27 MED ORDER — VARENICLINE TARTRATE 0.5 MG PO TABS: ORAL_TABLET | ORAL | 0 refills | Status: AC

## 2023-01-27 NOTE — Assessment & Plan Note (Signed)
Ordered PSA after discussing its limitations for prostate cancer screening, including false positive results leading to additional investigations. 

## 2023-01-27 NOTE — Assessment & Plan Note (Addendum)
Current worsening of dyspnea and chest tightness likely due to COPD exacerbation Mucinex or Robitussin as needed for cough Usually well-controlled with Breztri and PRN Albuterol Needs to cut down -> quit smoking

## 2023-01-27 NOTE — Assessment & Plan Note (Signed)
Noted on CT chest BP wnl, on Atorvastatin for HLD

## 2023-01-27 NOTE — Assessment & Plan Note (Signed)
Physical exam as documented. Fasting blood tests ordered. Refused flu and Shingrix vaccine. 

## 2023-01-27 NOTE — Progress Notes (Signed)
Established Patient Office Visit  Subjective:  Patient ID: Johncarlo Laxson, male    DOB: 11-02-1955  Age: 67 y.o. MRN: [id number]  CC:  Chief Complaint  Patient presents with   Annual Exam   Nicotine Dependence    Patient wants to quit smoking   chest congestion     Congestion in chest     HPI Raymond Kallay is a 67 y.o. male with past medical history of HTN, COPD, HLD and tobacco abuse who presents for annual physical.  HTN: BP is well-controlled. Takes medications regularly. Patient denies headache, dizziness, chest pain, dyspnea or palpitations.   COPD: He reports recent worsening of cough and chest tightness for the last 2 weeks. He uses Breztri and as needed albuterol for dyspnea.  He denies any fever, chills, or wheezing currently.  He still smokes about 1 pack/day.    Past Medical History:  Diagnosis Date   Hypertension    Kidney stones    Sinusitis, chronic 09/27/2013    Past Surgical History:  Procedure Laterality Date   tonsillectomy  age 49    Family History  Problem Relation Age of Onset   Heart disease Mother     Social History   Socioeconomic History   Marital status: Married    Spouse name: Not on file   Number of children: Not on file   Years of education: Not on file   Highest education level: Not on file  Occupational History   Occupation: Curator    Comment: Patent examiner  Tobacco Use   Smoking status: Every Day    Current packs/day: 1.00    Average packs/day: 1 pack/day for 48.0 years (48.0 ttl pk-yrs)    Types: Cigarettes   Smokeless tobacco: Former    Types: Chew    Quit date: 01/29/2004  Vaping Use   Vaping status: Never Used  Substance and Sexual Activity   Alcohol use: Not Currently   Drug use: No   Sexual activity: Yes  Other Topics Concern   Not on file  Social History Narrative   Not on file   Social Drivers of Health   Financial Resource Strain: Not on file  Food Insecurity: Not on file  Transportation Needs:  Not on file  Physical Activity: Not on file  Stress: Not on file  Social Connections: Not on file  Intimate Partner Violence: Not on file    Outpatient Medications Prior to Visit  Medication Sig Dispense Refill   albuterol (VENTOLIN HFA) 108 (90 Base) MCG/ACT inhaler INHALE 1-2 PUFFS BY MOUTH EVERY 6 HOURS AS NEEDED FOR WHEEZE OR SHORTNESS OF BREATH 6.7 each 3   amLODipine (NORVASC) 10 MG tablet TAKE 1 TABLET BY MOUTH EVERY DAY 90 tablet 1   atorvastatin (LIPITOR) 20 MG tablet TAKE 1 TABLET BY MOUTH EVERY DAY IN THE EVENING 90 tablet 1   BREZTRI AEROSPHERE 160-9-4.8 MCG/ACT AERO INHALE 2 PUFFS INTO THE LUNGS TWICE A DAY 32.1 each 3   Multiple Vitamin (MULTIVITAMIN WITH MINERALS) TABS tablet Take 1 tablet by mouth daily.     albuterol (VENTOLIN HFA) 108 (90 Base) MCG/ACT inhaler Inhale 1-2 puffs into the lungs every 6 (six) hours as needed for wheezing or shortness of breath. 18 each 5   albuterol (VENTOLIN HFA) 108 (90 Base) MCG/ACT inhaler INHALE 1-2 PUFFS BY MOUTH EVERY 6 HOURS AS NEEDED FOR WHEEZE OR SHORTNESS OF BREATH 8.5 each 5   No facility-administered medications prior to visit.    Allergies  Allergen  Reactions   Zyban [Bupropion]     Headache, CP    ROS Review of Systems  Constitutional:  Negative for chills and fever.  HENT:  Negative for congestion and sore throat.   Eyes:  Negative for pain and discharge.  Respiratory:  Positive for cough. Negative for shortness of breath.   Cardiovascular:  Negative for chest pain and palpitations.  Gastrointestinal:  Negative for constipation, diarrhea, nausea and vomiting.  Endocrine: Negative for polydipsia and polyuria.  Genitourinary:  Negative for dysuria and hematuria.  Musculoskeletal:  Negative for neck pain and neck stiffness.  Skin:  Negative for rash.  Neurological:  Negative for dizziness, weakness, numbness and headaches.  Psychiatric/Behavioral:  Negative for agitation and behavioral problems.       Objective:     Physical Exam Vitals reviewed.  Constitutional:      General: He is not in acute distress.    Appearance: He is not diaphoretic.  HENT:     Head: Normocephalic and atraumatic.     Nose: Nose normal.     Mouth/Throat:     Mouth: Mucous membranes are moist.  Eyes:     General: No scleral icterus.    Extraocular Movements: Extraocular movements intact.  Cardiovascular:     Rate and Rhythm: Normal rate and regular rhythm.     Heart sounds: Normal heart sounds. No murmur heard.    No gallop.  Pulmonary:     Breath sounds: Wheezing (Mild, diffuse) present. No rales.  Abdominal:     Palpations: Abdomen is soft.     Tenderness: There is no abdominal tenderness.  Musculoskeletal:     Cervical back: Neck supple. No tenderness.     Right lower leg: No edema.     Left lower leg: No edema.  Skin:    General: Skin is warm.     Findings: Bruising (Over b/l UE) present. No rash.  Neurological:     General: No focal deficit present.     Mental Status: He is alert and oriented to person, place, and time.     Cranial Nerves: No cranial nerve deficit.     Sensory: No sensory deficit.     Motor: No weakness.  Psychiatric:        Mood and Affect: Mood normal.        Behavior: Behavior normal.     BP 126/73 (BP Location: Left Arm, Patient Position: Sitting, Cuff Size: Normal)   Pulse 78   Ht 5\' 9"  (1.753 m)   Wt 170 lb 3.2 oz (77.2 kg)   SpO2 90%   BMI 25.13 kg/m  Wt Readings from Last 3 Encounters:  01/27/23 170 lb 3.2 oz (77.2 kg)  07/25/22 173 lb 3.2 oz (78.6 kg)  01/10/22 173 lb (78.5 kg)    Lab Results  Component Value Date   TSH 2.910 02/18/2022   Lab Results  Component Value Date   WBC 7.6 02/18/2022   HGB 16.8 02/18/2022   HCT 49.7 02/18/2022   MCV 91 02/18/2022   PLT 385 02/18/2022   Lab Results  Component Value Date   NA 142 02/18/2022   K 4.8 02/18/2022   CO2 25 02/18/2022   GLUCOSE 102 (H) 02/18/2022   BUN 10 02/18/2022   CREATININE 1.08 02/18/2022    BILITOT 0.9 02/18/2022   ALKPHOS 90 02/18/2022   AST 18 02/18/2022   ALT 13 02/18/2022   PROT 6.9 02/18/2022   ALBUMIN 4.5 02/18/2022   CALCIUM 9.9 02/18/2022  ANIONGAP 9 01/31/2020   EGFR 76 02/18/2022   Lab Results  Component Value Date   CHOL 125 02/18/2022   Lab Results  Component Value Date   HDL 47 02/18/2022   Lab Results  Component Value Date   LDLCALC 65 02/18/2022   Lab Results  Component Value Date   TRIG 59 02/18/2022   Lab Results  Component Value Date   CHOLHDL 2.7 02/18/2022   Lab Results  Component Value Date   HGBA1C 5.9 (H) 02/18/2022      Assessment & Plan:   Problem List Items Addressed This Visit       Cardiovascular and Mediastinum   Essential hypertension   BP Readings from Last 1 Encounters:  01/27/23 126/73   Well-controlled with Amlodipine Counseled for compliance with the medications Advised DASH diet and moderate exercise/walking, at least 150 mins/week      Relevant Orders   TSH   CMP14+EGFR   CBC with Differential/Platelet   Atherosclerosis of aorta (HCC)   Noted on CT chest BP wnl, on Atorvastatin for HLD        Respiratory   COPD (chronic obstructive pulmonary disease) (HCC)   Current worsening of dyspnea and chest tightness likely due to COPD exacerbation Mucinex or Robitussin as needed for cough Usually well-controlled with Breztri and PRN Albuterol Needs to cut down -> quit smoking      Relevant Medications   varenicline (CHANTIX) 0.5 MG tablet   varenicline (CHANTIX CONTINUING MONTH PAK) 1 MG tablet   methylPREDNISolone (MEDROL DOSEPAK) 4 MG TBPK tablet     Other   Tobacco abuse   Smokes about 1 pack/day  Asked about quitting: confirms that he/she currently smokes cigarettes Advise to quit smoking: Educated about QUITTING to reduce the risk of cancer, cardio and cerebrovascular disease. Assess willingness: Unwilling to quit at this time, but is working on cutting back. Assist with counseling and  pharmacotherapy: Counseled for 5 minutes and literature provided. Started Chantix. Arrange for follow up: follow up in 3 months and continue to offer help.      Relevant Medications   varenicline (CHANTIX) 0.5 MG tablet   varenicline (CHANTIX CONTINUING MONTH PAK) 1 MG tablet   Hyperlipidemia   Takes Atorvastatin, refilled Lipid profile reviewed      Relevant Orders   Lipid panel   Encounter for general adult medical examination with abnormal findings - Primary   Physical exam as documented. Fasting blood tests ordered. Refused flu and Shingrix vaccine.      Prostate cancer screening   Ordered PSA after discussing its limitations for prostate cancer screening, including false positive results leading to additional investigations.      Relevant Orders   PSA   Vitamin D deficiency   Advised to take Vitamin D 2000 IU QD      Relevant Orders   VITAMIN D 25 Hydroxy (Vit-D Deficiency, Fractures)   Other Visit Diagnoses       Prediabetes       Relevant Orders   Hemoglobin A1c   CMP14+EGFR       Meds ordered this encounter  Medications   varenicline (CHANTIX) 0.5 MG tablet    Sig: Take 1 tablet (0.5 mg total) by mouth daily for 3 days, THEN 1 tablet (0.5 mg total) 2 (two) times daily for 4 days.    Dispense:  11 tablet    Refill:  0   varenicline (CHANTIX CONTINUING MONTH PAK) 1 MG tablet    Sig: Take 1  tablet (1 mg total) by mouth 2 (two) times daily.    Dispense:  60 tablet    Refill:  2   methylPREDNISolone (MEDROL DOSEPAK) 4 MG TBPK tablet    Sig: Take as package instructions.    Dispense:  1 each    Refill:  0    Follow-up: Return in about 6 months (around 07/28/2023) for HTN and COPD.    Anabel Halon, MD

## 2023-01-27 NOTE — Assessment & Plan Note (Addendum)
Smokes about 1 pack/day  Asked about quitting: confirms that he/she currently smokes cigarettes Advise to quit smoking: Educated about QUITTING to reduce the risk of cancer, cardio and cerebrovascular disease. Assess willingness: Unwilling to quit at this time, but is working on cutting back. Assist with counseling and pharmacotherapy: Counseled for 5 minutes and literature provided. Started Chantix. Arrange for follow up: follow up in 3 months and continue to offer help.

## 2023-01-27 NOTE — Patient Instructions (Addendum)
Please start taking Prednisone as prescribed.  Start taking Chantix after completing Prednisone. Please take Chantix 0.5 mg once daily for 3 days, then 0.5 mg twice daily for 4 days and then 1 mg twice daily.  Please continue to take medications as prescribed.  Please continue to follow low salt diet and perform moderate exercise/walking at least 150 mins/week.  Please get fasting blood tests done within a week.

## 2023-01-27 NOTE — Assessment & Plan Note (Signed)
Advised to take Vitamin D 2000 IU QD °

## 2023-01-27 NOTE — Assessment & Plan Note (Signed)
Takes Atorvastatin, refilled Lipid profile reviewed

## 2023-01-27 NOTE — Assessment & Plan Note (Signed)
Lab Results  Component Value Date   HGBA1C 5.9 (H) 02/18/2022   Advised to follow DASH diet

## 2023-01-27 NOTE — Assessment & Plan Note (Signed)
BP Readings from Last 1 Encounters:  01/27/23 126/73   Well-controlled with Amlodipine Counseled for compliance with the medications Advised DASH diet and moderate exercise/walking, at least 150 mins/week

## 2023-02-19 LAB — CMP14+EGFR
ALT: 13 [IU]/L (ref 0–44)
AST: 19 [IU]/L (ref 0–40)
Albumin: 4.3 g/dL (ref 3.9–4.9)
Alkaline Phosphatase: 100 [IU]/L (ref 44–121)
BUN/Creatinine Ratio: 10 (ref 10–24)
BUN: 10 mg/dL (ref 8–27)
Bilirubin Total: 0.7 mg/dL (ref 0.0–1.2)
CO2: 23 mmol/L (ref 20–29)
Calcium: 9.3 mg/dL (ref 8.6–10.2)
Chloride: 104 mmol/L (ref 96–106)
Creatinine, Ser: 1.04 mg/dL (ref 0.76–1.27)
Globulin, Total: 2 g/dL (ref 1.5–4.5)
Glucose: 100 mg/dL — ABNORMAL HIGH (ref 70–99)
Potassium: 3.8 mmol/L (ref 3.5–5.2)
Sodium: 142 mmol/L (ref 134–144)
Total Protein: 6.3 g/dL (ref 6.0–8.5)
eGFR: 79 mL/min/{1.73_m2} (ref >59)

## 2023-02-19 LAB — CBC WITH DIFFERENTIAL/PLATELET
Basophils Absolute: 0.1 10*3/uL (ref 0.0–0.2)
Basos: 1 %
EOS (ABSOLUTE): 0.2 10*3/uL (ref 0.0–0.4)
Eos: 3 %
Hematocrit: 46.3 % (ref 37.5–51.0)
Hemoglobin: 15.5 g/dL (ref 13.0–17.7)
Immature Grans (Abs): 0 10*3/uL (ref 0.0–0.1)
Immature Granulocytes: 0 %
Lymphocytes Absolute: 2 10*3/uL (ref 0.7–3.1)
Lymphs: 26 %
MCH: 30.5 pg (ref 26.6–33.0)
MCHC: 33.5 g/dL (ref 31.5–35.7)
MCV: 91 fL (ref 79–97)
Monocytes Absolute: 0.7 10*3/uL (ref 0.1–0.9)
Monocytes: 9 %
Neutrophils Absolute: 4.7 10*3/uL (ref 1.4–7.0)
Neutrophils: 61 %
Platelets: 369 10*3/uL (ref 150–450)
RBC: 5.09 x10E6/uL (ref 4.14–5.80)
RDW: 12.3 % (ref 11.6–15.4)
WBC: 7.6 10*3/uL (ref 3.4–10.8)

## 2023-02-19 LAB — LIPID PANEL
Chol/HDL Ratio: 3.2 {ratio} (ref 0.0–5.0)
Cholesterol, Total: 126 mg/dL (ref 100–199)
HDL: 40 mg/dL (ref >39)
LDL Chol Calc (NIH): 73 mg/dL (ref 0–99)
Triglycerides: 59 mg/dL (ref 0–149)
VLDL Cholesterol Cal: 13 mg/dL (ref 5–40)

## 2023-02-19 LAB — VITAMIN D 25 HYDROXY (VIT D DEFICIENCY, FRACTURES): Vit D, 25-Hydroxy: 25.7 ng/mL — ABNORMAL LOW (ref 30.0–100.0)

## 2023-02-19 LAB — TSH: TSH: 1.95 u[IU]/mL (ref 0.450–4.500)

## 2023-02-19 LAB — HEMOGLOBIN A1C
Est. average glucose Bld gHb Est-mCnc: 126 mg/dL
Hgb A1c MFr Bld: 6 % — ABNORMAL HIGH (ref 4.8–5.6)

## 2023-02-19 LAB — PSA: Prostate Specific Ag, Serum: 0.5 ng/mL (ref 0.0–4.0)

## 2023-03-04 ENCOUNTER — Other Ambulatory Visit: Payer: Self-pay | Admitting: Internal Medicine

## 2023-03-04 DIAGNOSIS — Z720 Tobacco use: Secondary | ICD-10-CM

## 2023-03-04 DIAGNOSIS — I10 Essential (primary) hypertension: Secondary | ICD-10-CM

## 2023-04-03 ENCOUNTER — Other Ambulatory Visit: Payer: Self-pay | Admitting: Internal Medicine

## 2023-04-03 DIAGNOSIS — E782 Mixed hyperlipidemia: Secondary | ICD-10-CM

## 2023-06-20 ENCOUNTER — Other Ambulatory Visit: Payer: Self-pay | Admitting: Internal Medicine

## 2023-06-20 DIAGNOSIS — Z720 Tobacco use: Secondary | ICD-10-CM

## 2023-07-28 ENCOUNTER — Encounter: Payer: Self-pay | Admitting: Internal Medicine

## 2023-07-28 ENCOUNTER — Ambulatory Visit: Payer: Managed Care, Other (non HMO) | Admitting: Internal Medicine

## 2023-07-28 ENCOUNTER — Encounter: Payer: Self-pay | Admitting: Emergency Medicine

## 2023-07-28 VITALS — BP 113/66 | HR 73 | Ht 69.0 in | Wt 161.2 lb

## 2023-07-28 DIAGNOSIS — F17210 Nicotine dependence, cigarettes, uncomplicated: Secondary | ICD-10-CM | POA: Diagnosis not present

## 2023-07-28 DIAGNOSIS — R7303 Prediabetes: Secondary | ICD-10-CM

## 2023-07-28 DIAGNOSIS — E782 Mixed hyperlipidemia: Secondary | ICD-10-CM

## 2023-07-28 DIAGNOSIS — Z122 Encounter for screening for malignant neoplasm of respiratory organs: Secondary | ICD-10-CM

## 2023-07-28 DIAGNOSIS — Z720 Tobacco use: Secondary | ICD-10-CM

## 2023-07-28 DIAGNOSIS — J449 Chronic obstructive pulmonary disease, unspecified: Secondary | ICD-10-CM

## 2023-07-28 DIAGNOSIS — I10 Essential (primary) hypertension: Secondary | ICD-10-CM | POA: Diagnosis not present

## 2023-07-28 MED ORDER — ATORVASTATIN CALCIUM 20 MG PO TABS: 20.0000 mg | ORAL_TABLET | Freq: Every day | ORAL | 3 refills | Status: AC

## 2023-07-28 MED ORDER — AMLODIPINE BESYLATE 10 MG PO TABS: 10.0000 mg | ORAL_TABLET | Freq: Every day | ORAL | 3 refills | Status: AC

## 2023-07-28 NOTE — Progress Notes (Signed)
 Established Patient Office Visit  Subjective:  Patient ID: Donald Hale, male    DOB: 11-13-1955  Age: 68 y.o. MRN: [id number]  CC:  Chief Complaint  Patient presents with   Medical Management of Chronic Issues    6 Month f/u    HPI [name] is a 68 y.o. male with past medical history of HTN, COPD, HLD and tobacco abuse who presents for f/u of his chronic medical conditions.  HTN: BP is well-controlled. Takes medications regularly. Patient denies headache, dizziness, chest pain, dyspnea or palpitations.   COPD: He reports recent worsening of cough and chest tightness for the last 2 weeks, but he feels better now. He uses Breztri  and as needed albuterol  for dyspnea.  He denies any fever, chills, or wheezing currently.  He still smokes about 1 pack/day.  He was given Chantix  in the last visit, but he did not tolerate it- had nightmares and nausea.    Past Medical History:  Diagnosis Date   Hypertension    Kidney stones    Sinusitis, chronic 09/27/2013    Past Surgical History:  Procedure Laterality Date   tonsillectomy  age 43    Family History  Problem Relation Age of Onset   Heart disease Mother     Social History   Socioeconomic History   Marital status: Married    Spouse name: Not on file   Number of children: Not on file   Years of education: Not on file   Highest education level: Not on file  Occupational History   Occupation: Curator    Comment: Patent examiner  Tobacco Use   Smoking status: Every Day    Current packs/day: 1.00    Average packs/day: 1 pack/day for 48.0 years (48.0 ttl pk-yrs)    Types: Cigarettes   Smokeless tobacco: Former    Types: Chew    Quit date: 01/29/2004  Vaping Use   Vaping status: Never Used  Substance and Sexual Activity   Alcohol use: Not Currently   Drug use: No   Sexual activity: Yes  Other Topics Concern   Not on file  Social History Narrative   Not on file   Social Drivers of Health   Financial  Resource Strain: Not on file  Food Insecurity: Not on file  Transportation Needs: Not on file  Physical Activity: Not on file  Stress: Not on file  Social Connections: Not on file  Intimate Partner Violence: Not on file    Outpatient Medications Prior to Visit  Medication Sig Dispense Refill   albuterol  (VENTOLIN  HFA) 108 (90 Base) MCG/ACT inhaler INHALE 1-2 PUFFS BY MOUTH EVERY 6 HOURS AS NEEDED FOR WHEEZE OR SHORTNESS OF BREATH 6.7 each 3   BREZTRI  AEROSPHERE 160-9-4.8 MCG/ACT AERO INHALE 2 PUFFS INTO THE LUNGS TWICE A DAY 32.1 each 3   Multiple Vitamin (MULTIVITAMIN WITH MINERALS) TABS tablet Take 1 tablet by mouth daily.     amLODipine  (NORVASC ) 10 MG tablet TAKE 1 TABLET BY MOUTH EVERY DAY 90 tablet 1   atorvastatin  (LIPITOR) 20 MG tablet TAKE 1 TABLET BY MOUTH EVERY DAY IN THE EVENING 90 tablet 1   methylPREDNISolone  (MEDROL  DOSEPAK) 4 MG TBPK tablet Take as package instructions. 1 each 0   varenicline  (CHANTIX ) 1 MG tablet TAKE 1 TABLET BY MOUTH TWICE A DAY 180 tablet 0   No facility-administered medications prior to visit.    Allergies  Allergen Reactions   Zyban  [Bupropion ]     Headache, CP  ROS Review of Systems  Constitutional:  Negative for chills and fever.  HENT:  Negative for congestion and sore throat.   Eyes:  Negative for pain and discharge.  Respiratory:  Positive for cough. Negative for shortness of breath.   Cardiovascular:  Negative for chest pain and palpitations.  Gastrointestinal:  Negative for diarrhea, nausea and vomiting.  Endocrine: Negative for polydipsia and polyuria.  Genitourinary:  Negative for dysuria and hematuria.  Musculoskeletal:  Negative for neck pain and neck stiffness.  Skin:  Negative for rash.  Neurological:  Negative for dizziness, weakness, numbness and headaches.  Psychiatric/Behavioral:  Negative for agitation and behavioral problems.       Objective:    Physical Exam Vitals reviewed.  Constitutional:      General:  He is not in acute distress.    Appearance: He is not diaphoretic.  HENT:     Head: Normocephalic and atraumatic.     Nose: Nose normal.     Mouth/Throat:     Mouth: Mucous membranes are moist.   Eyes:     General: No scleral icterus.    Extraocular Movements: Extraocular movements intact.    Cardiovascular:     Rate and Rhythm: Normal rate and regular rhythm.     Heart sounds: Normal heart sounds. No murmur heard.    No gallop.  Pulmonary:     Breath sounds: Normal breath sounds. No wheezing or rales.  Abdominal:     Palpations: Abdomen is soft.     Tenderness: There is no abdominal tenderness.   Musculoskeletal:     Cervical back: Neck supple. No tenderness.     Right lower leg: No edema.     Left lower leg: No edema.   Skin:    General: Skin is warm.     Findings: Bruising (Over b/l UE) present. No rash.   Neurological:     General: No focal deficit present.     Mental Status: He is alert and oriented to person, place, and time.     Cranial Nerves: No cranial nerve deficit.     Sensory: No sensory deficit.     Motor: No weakness.   Psychiatric:        Mood and Affect: Mood normal.        Behavior: Behavior normal.     BP 113/66   Pulse 73   Ht 5' 9 (1.753 m)   Wt 161 lb 3.2 oz (73.1 kg)   SpO2 96%   BMI 23.81 kg/m  Wt Readings from Last 3 Encounters:  07/28/23 161 lb 3.2 oz (73.1 kg)  01/27/23 170 lb 3.2 oz (77.2 kg)  07/25/22 173 lb 3.2 oz (78.6 kg)    Lab Results  Component Value Date   TSH 1.950 02/18/2023   Lab Results  Component Value Date   WBC 7.6 02/18/2023   HGB 15.5 02/18/2023   HCT 46.3 02/18/2023   MCV 91 02/18/2023   PLT 369 02/18/2023   Lab Results  Component Value Date   NA 142 02/18/2023   K 3.8 02/18/2023   CO2 23 02/18/2023   GLUCOSE 100 (H) 02/18/2023   BUN 10 02/18/2023   CREATININE 1.04 02/18/2023   BILITOT 0.7 02/18/2023   ALKPHOS 100 02/18/2023   AST 19 02/18/2023   ALT 13 02/18/2023   PROT 6.3 02/18/2023    ALBUMIN 4.3 02/18/2023   CALCIUM  9.3 02/18/2023   ANIONGAP 9 01/31/2020   EGFR 79 02/18/2023   Lab Results  Component  Value Date   CHOL 126 02/18/2023   Lab Results  Component Value Date   HDL 40 02/18/2023   Lab Results  Component Value Date   LDLCALC 73 02/18/2023   Lab Results  Component Value Date   TRIG 59 02/18/2023   Lab Results  Component Value Date   CHOLHDL 3.2 02/18/2023   Lab Results  Component Value Date   HGBA1C 6.0 (H) 02/18/2023      Assessment & Plan:   Problem List Items Addressed This Visit       Cardiovascular and Mediastinum   Essential hypertension - Primary   BP Readings from Last 1 Encounters:  07/28/23 113/66   Well-controlled with Amlodipine  10 mg QD Counseled for compliance with the medications Advised DASH diet and moderate exercise/walking, at least 150 mins/week      Relevant Medications   amLODipine  (NORVASC ) 10 MG tablet   atorvastatin  (LIPITOR) 20 MG tablet     Respiratory   COPD (chronic obstructive pulmonary disease) (HCC)   Usually well-controlled with Breztri  and PRN Albuterol  Mucinex  or Robitussin as needed for cough Needs to cut down -> quit smoking        Other   Tobacco abuse   Smokes about 1 pack/day  Asked about quitting: confirms that he/she currently smokes cigarettes Advise to quit smoking: Educated about QUITTING to reduce the risk of cancer, cardio and cerebrovascular disease. Assess willingness: Unwilling to quit at this time, but is working on cutting back. Assist with counseling and pharmacotherapy: Counseled for 5 minutes and literature provided.  Did not tolerate Chantix . Arrange for follow up: follow up in 6 months and continue to offer help.      Hyperlipidemia   Takes Atorvastatin , refilled Lipid profile reviewed      Relevant Medications   amLODipine  (NORVASC ) 10 MG tablet   atorvastatin  (LIPITOR) 20 MG tablet   Prediabetes   Lab Results  Component Value Date   HGBA1C 6.0 (H)  02/18/2023   Advised to follow DASH diet      Screening for lung cancer   Has > 20-pack-year smoking history Referred to Pulmonology program for low-dose CT chest after discussing with the patient.      Relevant Orders   Ambulatory Referral Lung Cancer Screening [facility]     Meds ordered this encounter  Medications   amLODipine  (NORVASC ) 10 MG tablet    Sig: Take 1 tablet (10 mg total) by mouth daily.    Dispense:  90 tablet    Refill:  3   atorvastatin  (LIPITOR) 20 MG tablet    Sig: Take 1 tablet (20 mg total) by mouth daily.    Dispense:  90 tablet    Refill:  3    Follow-up: Return in about 6 months (around 01/27/2024) for Annual physical.    Suzzane MARLA Blanch, MD

## 2023-07-28 NOTE — Assessment & Plan Note (Signed)
 Lab Results  Component Value Date   HGBA1C 6.0 (H) 02/18/2023   Advised to follow DASH diet

## 2023-07-28 NOTE — Patient Instructions (Addendum)
Please continue to take medications as prescribed.  Please continue to follow low salt diet and perform moderate exercise/walking at least 150 mins/week. 

## 2023-07-28 NOTE — Assessment & Plan Note (Signed)
Takes Atorvastatin, refilled Lipid profile reviewed

## 2023-07-28 NOTE — Assessment & Plan Note (Signed)
 Usually well-controlled with Breztri  and PRN Albuterol  Mucinex  or Robitussin as needed for cough Needs to cut down -> quit smoking

## 2023-07-28 NOTE — Assessment & Plan Note (Addendum)
 Smokes about 1 pack/day  Asked about quitting: confirms that he/she currently smokes cigarettes Advise to quit smoking: Educated about QUITTING to reduce the risk of cancer, cardio and cerebrovascular disease. Assess willingness: Unwilling to quit at this time, but is working on cutting back. Assist with counseling and pharmacotherapy: Counseled for 5 minutes and literature provided.  Did not tolerate Chantix . Arrange for follow up: follow up in 6 months and continue to offer help.

## 2023-07-28 NOTE — Assessment & Plan Note (Signed)
 Has > 20-pack-year smoking history Referred to Pulmonology program for low-dose CT chest after discussing with the patient.

## 2023-07-28 NOTE — Assessment & Plan Note (Signed)
 BP Readings from Last 1 Encounters:  07/28/23 113/66   Well-controlled with Amlodipine  10 mg QD Counseled for compliance with the medications Advised DASH diet and moderate exercise/walking, at least 150 mins/week

## 2023-08-19 ENCOUNTER — Telehealth: Payer: Self-pay | Admitting: Acute Care

## 2023-08-19 DIAGNOSIS — Z87891 Personal history of nicotine dependence: Secondary | ICD-10-CM

## 2023-08-19 DIAGNOSIS — F17210 Nicotine dependence, cigarettes, uncomplicated: Secondary | ICD-10-CM

## 2023-08-19 DIAGNOSIS — Z122 Encounter for screening for malignant neoplasm of respiratory organs: Secondary | ICD-10-CM

## 2023-08-19 NOTE — Telephone Encounter (Signed)
 Lung Cancer Screening Narrative/Criteria Questionnaire (Cigarette Smokers Only- No Cigars/Pipes/vapes)   Donald Hale   SDMV:09/01/2023 8:00a [name]        25-May-1955   LDCT: 09/08/2023 8:30a AP    68 y.o.   Phone: [phone]  Lung Screening Narrative (confirm age 27-77 yrs Medicare / 50-80 yrs Private pay insurance)   Insurance information:[employer]   Referring Provider:Dr. Tobie    This screening involves an initial phone call with a team member from our program. It is called a shared decision making visit. The initial meeting is required by  insurance and Medicare to make sure you understand the program. This appointment takes about 15-20 minutes to complete. You will complete the screening scan at your scheduled date/time.  This scan takes about 5-10 minutes to complete. You can eat and drink normally before and after the scan.  Criteria questions for Lung Cancer Screening:   Are you a current or former smoker? Current Age began smoking: 68yo   If you are a former smoker, what year did you quit smoking? N/A(within 15 yrs)   To calculate your smoking history, I need an accurate estimate of how many packs of cigarettes you smoked per day and for how many years. (Not just the number of PPD you are now smoking)   Years smoking 50 x Packs per day 1 = Pack years 50   (at least 20 pack yrs)   (Make sure they understand that we need to know how much they have smoked in the past, not just the number of PPD they are smoking now)  Do you have a personal history of cancer?  No    Do you have a family history of cancer? No  Are you coughing up blood?  No  Have you had unexplained weight loss of 15 lbs or more in the last 6 months? No  It looks like you meet all criteria.  When would be a good time for us  to schedule you for this screening?   Additional information: N/A

## 2023-09-01 ENCOUNTER — Encounter: Payer: Self-pay | Admitting: Adult Health

## 2023-09-01 ENCOUNTER — Ambulatory Visit: Admitting: Adult Health

## 2023-09-01 DIAGNOSIS — F17210 Nicotine dependence, cigarettes, uncomplicated: Secondary | ICD-10-CM

## 2023-09-01 NOTE — Progress Notes (Signed)
  Virtual Visit via Telephone Note  I connected with Donald Hale , 09/01/23 8:07 AM by a telemedicine application and verified that I am speaking with the correct person using two identifiers.  Location: Patient: home Provider: home   I discussed the limitations of evaluation and management by telemedicine and the availability of in person appointments. The patient expressed understanding and agreed to proceed.   Shared Decision Making Visit Lung Cancer Screening Program [phone])   Eligibility: 68 y.o. Pack Years Smoking History Calculation = 50 pack years  (# packs/per year x # years smoked) Recent History of coughing up blood  no Unexplained weight loss? no ( >Than 15 pounds within the last 6 months ) Prior History Lung / other cancer no (Diagnosis within the last 5 years already requiring surveillance chest CT Scans). Smoking Status Current Smoker   Visit Components: Discussion included one or more decision making aids. YES Discussion included risk/benefits of screening. YES Discussion included potential follow up diagnostic testing for abnormal scans. YES Discussion included meaning and risk of over diagnosis. YES Discussion included meaning and risk of False Positives. YES Discussion included meaning of total radiation exposure. YES  Counseling Included: Importance of adherence to annual lung cancer LDCT screening. YES Impact of comorbidities on ability to participate in the program. YES Ability and willingness to under diagnostic treatment. YES  Smoking Cessation Counseling: Current Smokers:  Discussed importance of smoking cessation. yes Information about tobacco cessation classes and interventions provided to patient. yes Patient provided with ticket for LDCT Scan. yes Symptomatic Patient. NO Diagnosis Code: Tobacco Use Z72.0 Asymptomatic Patient yes  Counseling - 4 minutes of smoking cessation counseling (CT Chest Lung Cancer Screening Low Dose W/O CM)  [id number]  Z12.2-Screening of respiratory organs Z87.891-Personal history of nicotine dependence   [name] 09/01/23

## 2023-09-01 NOTE — Patient Instructions (Signed)

## 2023-09-08 ENCOUNTER — Ambulatory Visit (HOSPITAL_COMMUNITY)
Admission: RE | Admit: 2023-09-08 | Discharge: 2023-09-08 | Disposition: A | Discharge status: Home / Self Care | Source: Ambulatory Visit | Attending: Acute Care | Admitting: Acute Care

## 2023-09-08 DIAGNOSIS — Z122 Encounter for screening for malignant neoplasm of respiratory organs: Secondary | ICD-10-CM | POA: Diagnosis present

## 2023-09-08 DIAGNOSIS — Z87891 Personal history of nicotine dependence: Secondary | ICD-10-CM | POA: Diagnosis present

## 2023-09-08 DIAGNOSIS — F17210 Nicotine dependence, cigarettes, uncomplicated: Secondary | ICD-10-CM | POA: Insufficient documentation

## 2023-09-22 ENCOUNTER — Other Ambulatory Visit: Payer: Self-pay

## 2023-09-22 DIAGNOSIS — Z122 Encounter for screening for malignant neoplasm of respiratory organs: Secondary | ICD-10-CM

## 2023-09-22 DIAGNOSIS — F17210 Nicotine dependence, cigarettes, uncomplicated: Secondary | ICD-10-CM

## 2023-09-22 DIAGNOSIS — Z87891 Personal history of nicotine dependence: Secondary | ICD-10-CM

## 2023-11-06 DIAGNOSIS — M79671 Pain in right foot: Secondary | ICD-10-CM | POA: Diagnosis not present

## 2023-11-06 DIAGNOSIS — M21621 Bunionette of right foot: Secondary | ICD-10-CM | POA: Diagnosis not present

## 2023-11-06 DIAGNOSIS — L84 Corns and callosities: Secondary | ICD-10-CM | POA: Diagnosis not present

## 2023-11-13 ENCOUNTER — Other Ambulatory Visit: Payer: Self-pay | Admitting: Internal Medicine

## 2023-11-13 DIAGNOSIS — J449 Chronic obstructive pulmonary disease, unspecified: Secondary | ICD-10-CM

## 2023-11-13 DIAGNOSIS — Z720 Tobacco use: Secondary | ICD-10-CM

## 2023-12-04 DIAGNOSIS — M79671 Pain in right foot: Secondary | ICD-10-CM | POA: Diagnosis not present

## 2023-12-04 DIAGNOSIS — L84 Corns and callosities: Secondary | ICD-10-CM | POA: Diagnosis not present

## 2023-12-04 DIAGNOSIS — M21621 Bunionette of right foot: Secondary | ICD-10-CM | POA: Diagnosis not present

## 2024-01-27 ENCOUNTER — Ambulatory Visit (INDEPENDENT_AMBULATORY_CARE_PROVIDER_SITE_OTHER): Admitting: Internal Medicine

## 2024-01-27 ENCOUNTER — Encounter: Payer: Self-pay | Admitting: Internal Medicine

## 2024-01-27 VITALS — BP 123/75 | HR 69 | Ht 69.0 in | Wt 159.6 lb

## 2024-01-27 DIAGNOSIS — Z125 Encounter for screening for malignant neoplasm of prostate: Secondary | ICD-10-CM

## 2024-01-27 DIAGNOSIS — I10 Essential (primary) hypertension: Secondary | ICD-10-CM

## 2024-01-27 DIAGNOSIS — I700 Atherosclerosis of aorta: Secondary | ICD-10-CM | POA: Diagnosis not present

## 2024-01-27 DIAGNOSIS — J449 Chronic obstructive pulmonary disease, unspecified: Secondary | ICD-10-CM

## 2024-01-27 DIAGNOSIS — R7303 Prediabetes: Secondary | ICD-10-CM

## 2024-01-27 DIAGNOSIS — E559 Vitamin D deficiency, unspecified: Secondary | ICD-10-CM | POA: Diagnosis not present

## 2024-01-27 DIAGNOSIS — E782 Mixed hyperlipidemia: Secondary | ICD-10-CM | POA: Diagnosis not present

## 2024-01-27 DIAGNOSIS — B078 Other viral warts: Secondary | ICD-10-CM | POA: Diagnosis not present

## 2024-01-27 DIAGNOSIS — R252 Cramp and spasm: Secondary | ICD-10-CM

## 2024-01-27 DIAGNOSIS — Z0001 Encounter for general adult medical examination with abnormal findings: Secondary | ICD-10-CM

## 2024-01-27 MED ORDER — BREZTRI AEROSPHERE 160-9-4.8 MCG/ACT IN AERO: 2.0000 | INHALATION_SPRAY | Freq: Two times a day (BID) | RESPIRATORY_TRACT | 3 refills | Status: AC

## 2024-01-27 NOTE — Assessment & Plan Note (Addendum)
 Takes Atorvastatin , refilled Check lipid profile

## 2024-01-27 NOTE — Assessment & Plan Note (Signed)
Noted on CT chest BP wnl, on Atorvastatin for HLD

## 2024-01-27 NOTE — Patient Instructions (Addendum)
Please continue to take medications as prescribed.  Please continue to follow low salt diet and perform moderate exercise/walking at least 150 mins/week.  Please get fasting blood tests done within a week.

## 2024-01-27 NOTE — Assessment & Plan Note (Signed)
 Could be due to electrolyte deficiencies and/or statin Advised to take magnesium supplement and co-Q10 Advised to cut back caffeinated drinks/soft drinks

## 2024-01-27 NOTE — Assessment & Plan Note (Signed)
Advised to take Vitamin D 2000 IU QD °

## 2024-01-27 NOTE — Assessment & Plan Note (Signed)
 Usually well-controlled with Breztri  and PRN Albuterol  Mucinex  or Robitussin as needed for cough Needs to cut down -> quit smoking

## 2024-01-27 NOTE — Assessment & Plan Note (Signed)
 Advised to apply salicylic acid locally

## 2024-01-27 NOTE — Assessment & Plan Note (Signed)
 Lab Results  Component Value Date   HGBA1C 6.0 (H) 02/18/2023   Advised to follow DASH diet

## 2024-01-27 NOTE — Assessment & Plan Note (Signed)
 BP Readings from Last 1 Encounters:  01/27/24 123/75   Well-controlled with Amlodipine  10 mg QD Counseled for compliance with the medications Advised DASH diet and moderate exercise/walking, at least 150 mins/week

## 2024-01-27 NOTE — Assessment & Plan Note (Signed)
 Ordered PSA after discussing its limitations for prostate cancer screening, including false positive results leading to additional investigations.

## 2024-01-27 NOTE — Progress Notes (Signed)
 "  Established Patient Office Visit  Subjective:  Patient ID: Tykel Badie, male    DOB: [birth year]  Age: 68 y.o. MRN: [id number]  CC:  Chief Complaint  Patient presents with   Annual Exam    cpe    HPI Gianfranco Araki is a 68 y.o. male with past medical history of HTN, COPD, HLD and tobacco abuse who presents for f/u of his chronic medical conditions.  HTN: BP is well-controlled. Takes medications regularly. Patient denies headache, dizziness, chest pain, dyspnea or palpitations.   COPD: He uses Breztri  and as needed albuterol  for dyspnea.  Denies any recent worsening of cough or dyspnea.  He denies any fever, chills, or wheezing currently.  He still smokes about 1 pack/day.  He was given Chantix  previously, but he did not tolerate it- had nightmares and nausea.  He reports left-sided lower leg pain, especially at nighttime.  Denies any local lesion.  He admits that he takes multiple [employer] in a day.  He has retired now.  Past Medical History:  Diagnosis Date   Hypertension    Kidney stones    Sinusitis, chronic 09/27/2013    Past Surgical History:  Procedure Laterality Date   tonsillectomy  age 89    Family History  Problem Relation Age of Onset   Heart disease Mother     Social History   Socioeconomic History   Marital status: Married    Spouse name: Not on file   Number of children: Not on file   Years of education: Not on file   Highest education level: Not on file  Occupational History   Occupation: Curator    Comment: Patent Examiner  Tobacco Use   Smoking status: Every Day    Current packs/day: 1.00    Average packs/day: 1 pack/day for 48.0 years (48.0 ttl pk-yrs)    Types: Cigarettes   Smokeless tobacco: Former    Types: Chew    Quit date: 01/29/2004  Vaping Use   Vaping status: Never Used  Substance and Sexual Activity   Alcohol use: Not Currently   Drug use: No   Sexual activity: Yes  Other Topics Concern   Not on file  Social  History Narrative   Not on file   Social Drivers of Health   Tobacco Use: High Risk (01/27/2024)   Patient History    Smoking Tobacco Use: Every Day    Smokeless Tobacco Use: Former    Passive Exposure: Not on Actuary Strain: Not on file  Food Insecurity: Not on file  Transportation Needs: Not on file  Physical Activity: Not on file  Stress: Not on file  Social Connections: Not on file  Intimate Partner Violence: Not on file  Depression (PHQ2-9): Low Risk (01/27/2024)   Depression (PHQ2-9)    PHQ-2 Score: 1  Alcohol Screen: Not on file  Housing: Not on file  Utilities: Not on file  Health Literacy: Not on file    Outpatient Medications Prior to Visit  Medication Sig Dispense Refill   albuterol  (VENTOLIN  HFA) 108 (90 Base) MCG/ACT inhaler INHALE 1-2 PUFFS BY MOUTH EVERY 6 HOURS AS NEEDED FOR WHEEZE OR SHORTNESS OF BREATH 6.7 each 3   amLODipine  (NORVASC ) 10 MG tablet Take 1 tablet (10 mg total) by mouth daily. 90 tablet 3   atorvastatin  (LIPITOR) 20 MG tablet Take 1 tablet (20 mg total) by mouth daily. 90 tablet 3   Multiple Vitamin (MULTIVITAMIN WITH MINERALS) TABS tablet Take 1 tablet  by mouth daily.     BREZTRI  AEROSPHERE 160-9-4.8 MCG/ACT AERO INHALE 2 PUFFS INTO THE LUNGS TWICE A DAY 32.1 each 3   No facility-administered medications prior to visit.    Allergies  Allergen Reactions   Zyban  [Bupropion ]     Headache, CP    ROS Review of Systems  Constitutional:  Negative for chills and fever.  HENT:  Negative for congestion and sore throat.   Eyes:  Negative for pain and discharge.  Respiratory:  Positive for cough (Chronic). Negative for shortness of breath.   Cardiovascular:  Negative for chest pain and palpitations.  Gastrointestinal:  Negative for diarrhea, nausea and vomiting.  Endocrine: Negative for polydipsia and polyuria.  Genitourinary:  Negative for dysuria and hematuria.  Musculoskeletal:  Negative for neck pain and neck stiffness.   Skin:  Negative for rash.  Neurological:  Negative for dizziness, weakness, numbness and headaches.  Psychiatric/Behavioral:  Negative for agitation and behavioral problems.       Objective:    Physical Exam Vitals reviewed.  Constitutional:      General: He is not in acute distress.    Appearance: He is not diaphoretic.  HENT:     Head: Normocephalic and atraumatic.     Nose: Nose normal.     Mouth/Throat:     Mouth: Mucous membranes are moist.  Eyes:     General: No scleral icterus.    Extraocular Movements: Extraocular movements intact.  Cardiovascular:     Rate and Rhythm: Normal rate and regular rhythm.     Heart sounds: Normal heart sounds. No murmur heard.    No gallop.  Pulmonary:     Breath sounds: Normal breath sounds. No wheezing or rales.  Abdominal:     Palpations: Abdomen is soft.     Tenderness: There is no abdominal tenderness.  Musculoskeletal:     Cervical back: Neck supple. No tenderness.     Right lower leg: No edema.     Left lower leg: No edema.  Skin:    General: Skin is warm.     Findings: Lesion (Hyperkeratotic plaque over right index finger) present. No rash.     Comments: About 1 cm in diameter cyst over dorsum of right wrist  Neurological:     General: No focal deficit present.     Mental Status: He is alert and oriented to person, place, and time.     Cranial Nerves: No cranial nerve deficit.     Sensory: No sensory deficit.     Motor: No weakness.  Psychiatric:        Mood and Affect: Mood normal.        Behavior: Behavior normal.     BP 123/75   Pulse 69   Ht 5' 9 (1.753 m)   Wt 159 lb 9.6 oz (72.4 kg)   SpO2 96%   BMI 23.57 kg/m  Wt Readings from Last 3 Encounters:  01/27/24 159 lb 9.6 oz (72.4 kg)  07/28/23 161 lb 3.2 oz (73.1 kg)  01/27/23 170 lb 3.2 oz (77.2 kg)    Lab Results  Component Value Date   TSH 1.950 02/18/2023   Lab Results  Component Value Date   WBC 7.6 02/18/2023   HGB 15.5 02/18/2023   HCT 46.3  02/18/2023   MCV 91 02/18/2023   PLT 369 02/18/2023   Lab Results  Component Value Date   NA 142 02/18/2023   K 3.8 02/18/2023   CO2 23 02/18/2023  GLUCOSE 100 (H) 02/18/2023   BUN 10 02/18/2023   CREATININE 1.04 02/18/2023   BILITOT 0.7 02/18/2023   ALKPHOS 100 02/18/2023   AST 19 02/18/2023   ALT 13 02/18/2023   PROT 6.3 02/18/2023   ALBUMIN 4.3 02/18/2023   CALCIUM  9.3 02/18/2023   ANIONGAP 9 01/31/2020   EGFR 79 02/18/2023   Lab Results  Component Value Date   CHOL 126 02/18/2023   Lab Results  Component Value Date   HDL 40 02/18/2023   Lab Results  Component Value Date   LDLCALC 73 02/18/2023   Lab Results  Component Value Date   TRIG 59 02/18/2023   Lab Results  Component Value Date   CHOLHDL 3.2 02/18/2023   Lab Results  Component Value Date   HGBA1C 6.0 (H) 02/18/2023      Assessment & Plan:   Problem List Items Addressed This Visit       Cardiovascular and Mediastinum   Essential hypertension   BP Readings from Last 1 Encounters:  01/27/24 123/75   Well-controlled with Amlodipine  10 mg QD Counseled for compliance with the medications Advised DASH diet and moderate exercise/walking, at least 150 mins/week      Relevant Orders   TSH   CMP14+EGFR   CBC with Differential/Platelet   Atherosclerosis of aorta   Noted on CT chest BP wnl, on Atorvastatin  for HLD        Respiratory   COPD (chronic obstructive pulmonary disease) (HCC)   Usually well-controlled with Breztri  and PRN Albuterol  Mucinex  or Robitussin as needed for cough Needs to cut down -> quit smoking      Relevant Medications   budesonide-glycopyrrolate-formoterol (BREZTRI  AEROSPHERE) 160-9-4.8 MCG/ACT AERO inhaler     Musculoskeletal and Integument   Palmar wart   Advised to apply salicylic acid locally        Other   Hyperlipidemia   Takes Atorvastatin , refilled Check lipid profile      Relevant Orders   Lipid panel   Encounter for general adult medical  examination with abnormal findings - Primary   Physical exam as documented. Fasting blood tests ordered. Refused flu and Shingrix vaccine.      Prostate cancer screening   Ordered PSA after discussing its limitations for prostate cancer screening, including false positive results leading to additional investigations.      Relevant Orders   PSA   Vitamin D  deficiency   Advised to take Vitamin D  2000 IU QD      Relevant Orders   VITAMIN D  25 Hydroxy (Vit-D Deficiency, Fractures)   Leg cramps   Could be due to electrolyte deficiencies and/or statin Advised to take magnesium supplement and co-Q10 Advised to cut back caffeinated drinks/soft drinks      Prediabetes   Lab Results  Component Value Date   HGBA1C 6.0 (H) 02/18/2023   Advised to follow DASH diet      Relevant Orders   Hemoglobin A1c      Meds ordered this encounter  Medications   budesonide-glycopyrrolate-formoterol (BREZTRI  AEROSPHERE) 160-9-4.8 MCG/ACT AERO inhaler    Sig: Inhale 2 puffs into the lungs 2 (two) times daily.    Dispense:  32.1 each    Refill:  3    Follow-up: Return in about 6 months (around 07/27/2024) for AWV.    Suzzane MARLA Blanch, MD "

## 2024-01-27 NOTE — Assessment & Plan Note (Signed)
Physical exam as documented. Fasting blood tests ordered. Refused flu and Shingrix vaccine. 

## 2024-02-04 ENCOUNTER — Ambulatory Visit: Payer: Self-pay | Admitting: Internal Medicine

## 2024-02-04 LAB — CMP14+EGFR
ALT: 12 IU/L (ref 0–44)
AST: 17 IU/L (ref 0–40)
Albumin: 4.3 g/dL (ref 3.9–4.9)
Alkaline Phosphatase: 85 IU/L (ref 47–123)
BUN/Creatinine Ratio: 9 — ABNORMAL LOW (ref 10–24)
BUN: 11 mg/dL (ref 8–27)
Bilirubin Total: 1 mg/dL (ref 0.0–1.2)
CO2: 26 mmol/L (ref 20–29)
Calcium: 9.6 mg/dL (ref 8.6–10.2)
Chloride: 101 mmol/L (ref 96–106)
Creatinine, Ser: 1.19 mg/dL (ref 0.76–1.27)
Globulin, Total: 2.3 g/dL (ref 1.5–4.5)
Glucose: 87 mg/dL (ref 70–99)
Potassium: 4.4 mmol/L (ref 3.5–5.2)
Sodium: 142 mmol/L (ref 134–144)
Total Protein: 6.6 g/dL (ref 6.0–8.5)
eGFR: 67 mL/min/1.73

## 2024-02-04 LAB — LIPID PANEL
Chol/HDL Ratio: 2.5 ratio (ref 0.0–5.0)
Cholesterol, Total: 127 mg/dL (ref 100–199)
HDL: 50 mg/dL
LDL Chol Calc (NIH): 64 mg/dL (ref 0–99)
Triglycerides: 58 mg/dL (ref 0–149)
VLDL Cholesterol Cal: 13 mg/dL (ref 5–40)

## 2024-02-04 LAB — CBC WITH DIFFERENTIAL/PLATELET
Basophils Absolute: 0.1 x10E3/uL (ref 0.0–0.2)
Basos: 1 %
EOS (ABSOLUTE): 0.3 x10E3/uL (ref 0.0–0.4)
Eos: 4 %
Hematocrit: 50.2 % (ref 37.5–51.0)
Hemoglobin: 16.3 g/dL (ref 13.0–17.7)
Immature Grans (Abs): 0 x10E3/uL (ref 0.0–0.1)
Immature Granulocytes: 0 %
Lymphocytes Absolute: 2.6 x10E3/uL (ref 0.7–3.1)
Lymphs: 33 %
MCH: 30.9 pg (ref 26.6–33.0)
MCHC: 32.5 g/dL (ref 31.5–35.7)
MCV: 95 fL (ref 79–97)
Monocytes Absolute: 0.8 x10E3/uL (ref 0.1–0.9)
Monocytes: 10 %
Neutrophils Absolute: 4 x10E3/uL (ref 1.4–7.0)
Neutrophils: 52 %
Platelets: 377 x10E3/uL (ref 150–450)
RBC: 5.27 x10E6/uL (ref 4.14–5.80)
RDW: 12 % (ref 11.6–15.4)
WBC: 7.7 x10E3/uL (ref 3.4–10.8)

## 2024-02-04 LAB — HEMOGLOBIN A1C
Est. average glucose Bld gHb Est-mCnc: 114 mg/dL
Hgb A1c MFr Bld: 5.6 % (ref 4.8–5.6)

## 2024-02-04 LAB — TSH: TSH: 2.25 u[IU]/mL (ref 0.450–4.500)

## 2024-02-04 LAB — PSA: Prostate Specific Ag, Serum: 0.7 ng/mL (ref 0.0–4.0)

## 2024-02-04 LAB — VITAMIN D 25 HYDROXY (VIT D DEFICIENCY, FRACTURES): Vit D, 25-Hydroxy: 85.6 ng/mL (ref 30.0–100.0)

## 2024-02-10 ENCOUNTER — Other Ambulatory Visit: Payer: Self-pay | Admitting: Internal Medicine

## 2024-02-10 DIAGNOSIS — J449 Chronic obstructive pulmonary disease, unspecified: Secondary | ICD-10-CM

## 2024-02-11 ENCOUNTER — Other Ambulatory Visit: Payer: Self-pay | Admitting: Internal Medicine

## 2024-02-11 DIAGNOSIS — J449 Chronic obstructive pulmonary disease, unspecified: Secondary | ICD-10-CM

## 2024-02-11 MED ORDER — FLUTICASONE-SALMETEROL 100-50 MCG/ACT IN AEPB: 1.0000 | INHALATION_SPRAY | Freq: Two times a day (BID) | RESPIRATORY_TRACT | 3 refills | Status: AC

## 2024-07-27 ENCOUNTER — Ambulatory Visit: Payer: Self-pay | Admitting: Internal Medicine
